# Patient Record
Sex: Female | Born: 1990
Health system: Southern US, Community
[De-identification: ages and names within clinical notes are randomized; demographics above are authoritative.]

## PROBLEM LIST (undated history)

## (undated) DIAGNOSIS — R519 Headache, unspecified: Secondary | ICD-10-CM

## (undated) DIAGNOSIS — R51 Headache: Secondary | ICD-10-CM

## (undated) DIAGNOSIS — K769 Liver disease, unspecified: Secondary | ICD-10-CM

## (undated) DIAGNOSIS — Z8619 Personal history of other infectious and parasitic diseases: Secondary | ICD-10-CM

## (undated) DIAGNOSIS — O24419 Gestational diabetes mellitus in pregnancy, unspecified control: Secondary | ICD-10-CM

## (undated) HISTORY — PX: KNEE SURGERY: SHX244

---
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
## 2009-08-22 ENCOUNTER — Encounter: Admission: RE | Admit: 2009-08-22 | Discharge: 2009-08-22 | Payer: Self-pay | Admitting: Pulmonary Disease

## 2010-06-19 NOTE — Op Note (Signed)
NAMETOBI, GROESBECK              ACCOUNT NO.:  [id number]   MEDICAL RECORD NO.:  [id number]          PATIENT TYPE:  AMB   LOCATION:  DSC                          FACILITY:  [facility]   PHYSICIAN:  Lubertha Basque. Dalldorf, M.D.DATE OF BIRTH:  [birth year]   DATE OF PROCEDURE:  03/29/2006  DATE OF DISCHARGE:                               OPERATIVE REPORT   PREOPERATIVE DIAGNOSIS:  Right knee torn lateral meniscus.   POSTOPERATIVE DIAGNOSIS:  Right knee torn lateral meniscus.   PROCEDURE:  Right knee partial lateral meniscectomy.   ANESTHESIA:  General.   ATTENDING SURGEON:  Lubertha Basque. Jerl Santos, M.D.   ASSISTANT:  Lindwood Qua, P.A.   INDICATIONS FOR PROCEDURE:  The patient is a 20 year old girl with  several months of right knee pain and swelling.  She has an occasional  locking episode as well.  She has pain which limits her ability to  exercise and to rest.  She has undergone an MRI scan which shows a  complex lateral meniscus tear.  She is offered arthroscopy.  Informed  operative consent was obtained discussion of possible complications of  reaction to anesthesia and infection.   SUMMARY OF FINDINGS AND PROCEDURE:  Under general anesthesia a right  knee arthroscopy was performed.  Medial compartment exhibited no  evidence of meniscal or articular cartilage injury.  The ACL appeared  intact.  The patellofemoral portion of the knee was also benign though  it did track in a fairly lateral position.  In the lateral compartment,  she did have a complex tear of the posterior horn lateral meniscus.  This was addressed with about a 15% partial lateral meniscectomy.  She  was left with some good substantial tissue anterior to the popliteal  tendon.  There were no degenerative changes in this compartment that I  could appreciate.   DESCRIPTION OF PROCEDURE:  The patient was taken to the operative suite  where general anesthetic was applied without difficulty.  She was  positioned supine  and prepped, draped in normal sterile fashion.  After  administration of preop IV Kefzol an arthroscopy of the right knee was  performed through two inferior portals.  Findings were as noted above  and procedure consisted of a partial lateral meniscectomy done with  shaver and basket removing about 15% of this structure predominantly in  the posterior horn.  The knee was thoroughly irrigated followed by  placement of some Marcaine with epinephrine and morphine.  Adaptic was  placed over the portals followed by dry gauze and loose Ace wrap.  Estimated blood loss and intraoperative fluids can be obtained from  anesthesia records.   DISPOSITION:  The patient was extubated in the operating room and taken  to recovery room in stable addition.  She was to go home same-day and to  follow-up in the office in less than week.  I will contact her by phone  tonight.      Lubertha Basque Jerl Santos, M.D.  Electronically Signed     PGD/MEDQ  D:  03/29/2006  T:  03/29/2006  Job:  [id number]

## 2011-04-15 ENCOUNTER — Other Ambulatory Visit (HOSPITAL_COMMUNITY): Payer: Self-pay | Admitting: Pulmonary Disease

## 2011-04-15 ENCOUNTER — Ambulatory Visit (HOSPITAL_COMMUNITY)
Admission: RE | Admit: 2011-04-15 | Discharge: 2011-04-15 | Disposition: A | Discharge status: Home / Self Care | Payer: BC Managed Care – PPO | Source: Ambulatory Visit | Attending: Pulmonary Disease | Admitting: Pulmonary Disease

## 2011-04-15 DIAGNOSIS — M79609 Pain in unspecified limb: Secondary | ICD-10-CM | POA: Insufficient documentation

## 2011-04-15 DIAGNOSIS — M7989 Other specified soft tissue disorders: Secondary | ICD-10-CM | POA: Insufficient documentation

## 2011-04-15 DIAGNOSIS — M79671 Pain in right foot: Secondary | ICD-10-CM

## 2014-07-10 ENCOUNTER — Encounter (INDEPENDENT_AMBULATORY_CARE_PROVIDER_SITE_OTHER): Payer: Self-pay

## 2014-07-10 ENCOUNTER — Ambulatory Visit (HOSPITAL_COMMUNITY)
Admission: RE | Admit: 2014-07-10 | Discharge: 2014-07-10 | Disposition: A | Discharge status: Home / Self Care | Payer: 59 | Source: Ambulatory Visit | Attending: Pulmonary Disease | Admitting: Pulmonary Disease

## 2014-07-10 ENCOUNTER — Other Ambulatory Visit (HOSPITAL_COMMUNITY): Payer: Self-pay | Admitting: Pulmonary Disease

## 2014-07-10 DIAGNOSIS — R52 Pain, unspecified: Secondary | ICD-10-CM

## 2014-07-10 DIAGNOSIS — M79671 Pain in right foot: Secondary | ICD-10-CM | POA: Insufficient documentation

## 2015-01-16 ENCOUNTER — Other Ambulatory Visit (HOSPITAL_COMMUNITY): Payer: Self-pay | Admitting: Pulmonary Disease

## 2015-01-16 ENCOUNTER — Ambulatory Visit (HOSPITAL_COMMUNITY)
Admission: RE | Admit: 2015-01-16 | Discharge: 2015-01-16 | Disposition: A | Discharge status: Home / Self Care | Payer: 59 | Source: Ambulatory Visit | Attending: Pulmonary Disease | Admitting: Pulmonary Disease

## 2015-01-16 DIAGNOSIS — R05 Cough: Secondary | ICD-10-CM | POA: Insufficient documentation

## 2015-01-16 DIAGNOSIS — R0989 Other specified symptoms and signs involving the circulatory and respiratory systems: Secondary | ICD-10-CM | POA: Diagnosis not present

## 2015-01-16 DIAGNOSIS — R059 Cough, unspecified: Secondary | ICD-10-CM

## 2015-02-19 DIAGNOSIS — N911 Secondary amenorrhea: Secondary | ICD-10-CM | POA: Diagnosis not present

## 2015-03-05 DIAGNOSIS — Z3401 Encounter for supervision of normal first pregnancy, first trimester: Secondary | ICD-10-CM | POA: Diagnosis not present

## 2015-03-05 DIAGNOSIS — Z36 Encounter for antenatal screening of mother: Secondary | ICD-10-CM | POA: Diagnosis not present

## 2015-03-05 DIAGNOSIS — Z3A09 9 weeks gestation of pregnancy: Secondary | ICD-10-CM | POA: Diagnosis not present

## 2015-03-05 LAB — OB RESULTS CONSOLE HEPATITIS B SURFACE ANTIGEN: Hepatitis B Surface Ag: NEGATIVE

## 2015-03-05 LAB — OB RESULTS CONSOLE GC/CHLAMYDIA
Chlamydia: NEGATIVE
Gonorrhea: NEGATIVE

## 2015-03-05 LAB — OB RESULTS CONSOLE HIV ANTIBODY (ROUTINE TESTING): HIV: NONREACTIVE

## 2015-03-05 LAB — OB RESULTS CONSOLE RPR: RPR: NONREACTIVE

## 2015-03-05 LAB — OB RESULTS CONSOLE RUBELLA ANTIBODY, IGM: Rubella: IMMUNE

## 2015-03-05 LAB — OB RESULTS CONSOLE ABO/RH: RH TYPE: POSITIVE

## 2015-03-05 LAB — OB RESULTS CONSOLE ANTIBODY SCREEN: Antibody Screen: NEGATIVE

## 2015-03-12 DIAGNOSIS — Z36 Encounter for antenatal screening of mother: Secondary | ICD-10-CM | POA: Diagnosis not present

## 2015-03-13 DIAGNOSIS — Z3A11 11 weeks gestation of pregnancy: Secondary | ICD-10-CM | POA: Diagnosis not present

## 2015-03-13 DIAGNOSIS — Z3401 Encounter for supervision of normal first pregnancy, first trimester: Secondary | ICD-10-CM | POA: Diagnosis not present

## 2015-03-19 DIAGNOSIS — Z3491 Encounter for supervision of normal pregnancy, unspecified, first trimester: Secondary | ICD-10-CM | POA: Diagnosis not present

## 2015-03-19 DIAGNOSIS — Z36 Encounter for antenatal screening of mother: Secondary | ICD-10-CM | POA: Diagnosis not present

## 2015-05-07 DIAGNOSIS — Z3402 Encounter for supervision of normal first pregnancy, second trimester: Secondary | ICD-10-CM | POA: Diagnosis not present

## 2015-05-07 DIAGNOSIS — Z36 Encounter for antenatal screening of mother: Secondary | ICD-10-CM | POA: Diagnosis not present

## 2015-05-07 DIAGNOSIS — Z3A18 18 weeks gestation of pregnancy: Secondary | ICD-10-CM | POA: Diagnosis not present

## 2015-06-05 DIAGNOSIS — Z3402 Encounter for supervision of normal first pregnancy, second trimester: Secondary | ICD-10-CM | POA: Diagnosis not present

## 2015-06-05 DIAGNOSIS — O350XX0 Maternal care for (suspected) central nervous system malformation in fetus, not applicable or unspecified: Secondary | ICD-10-CM | POA: Diagnosis not present

## 2015-06-05 DIAGNOSIS — Z3A23 23 weeks gestation of pregnancy: Secondary | ICD-10-CM | POA: Diagnosis not present

## 2015-07-10 DIAGNOSIS — Z36 Encounter for antenatal screening of mother: Secondary | ICD-10-CM | POA: Diagnosis not present

## 2015-07-10 DIAGNOSIS — Z3400 Encounter for supervision of normal first pregnancy, unspecified trimester: Secondary | ICD-10-CM | POA: Diagnosis not present

## 2015-07-10 DIAGNOSIS — Z23 Encounter for immunization: Secondary | ICD-10-CM | POA: Diagnosis not present

## 2015-07-17 DIAGNOSIS — O99810 Abnormal glucose complicating pregnancy: Secondary | ICD-10-CM | POA: Diagnosis not present

## 2015-08-25 DIAGNOSIS — D509 Iron deficiency anemia, unspecified: Secondary | ICD-10-CM | POA: Diagnosis not present

## 2015-08-25 DIAGNOSIS — Z3400 Encounter for supervision of normal first pregnancy, unspecified trimester: Secondary | ICD-10-CM | POA: Diagnosis not present

## 2015-09-04 DIAGNOSIS — Z36 Encounter for antenatal screening of mother: Secondary | ICD-10-CM | POA: Diagnosis not present

## 2015-09-04 DIAGNOSIS — Z3A36 36 weeks gestation of pregnancy: Secondary | ICD-10-CM | POA: Diagnosis not present

## 2015-09-04 DIAGNOSIS — Z3400 Encounter for supervision of normal first pregnancy, unspecified trimester: Secondary | ICD-10-CM | POA: Diagnosis not present

## 2015-09-04 DIAGNOSIS — O3663X0 Maternal care for excessive fetal growth, third trimester, not applicable or unspecified: Secondary | ICD-10-CM | POA: Diagnosis not present

## 2015-09-30 ENCOUNTER — Encounter (HOSPITAL_COMMUNITY): Payer: Self-pay | Admitting: *Deleted

## 2015-09-30 DIAGNOSIS — Z3400 Encounter for supervision of normal first pregnancy, unspecified trimester: Secondary | ICD-10-CM | POA: Diagnosis not present

## 2015-09-30 DIAGNOSIS — O3663X0 Maternal care for excessive fetal growth, third trimester, not applicable or unspecified: Secondary | ICD-10-CM | POA: Diagnosis not present

## 2015-09-30 DIAGNOSIS — Z3A39 39 weeks gestation of pregnancy: Secondary | ICD-10-CM | POA: Diagnosis not present

## 2015-09-30 NOTE — H&P (Signed)
Gina Richard is a 25 y.o. female presenting for primary C/S secondary to suspected fetal macrosomia.  Last u/s 8/29 EFW 4461g with large AC.  Patient was counseled re: risks of shoulder dystocia/traumatic vaginal delivery vs primary C/S and chose the latter.  Otherwise uncomplicated pregnancy.  GBS negative.  OB History    No data available     No past medical history on file. No past surgical history on file. Family History: family history is not on file. Social History:  has no tobacco, alcohol, and drug history on file.     Maternal Diabetes: No Genetic Screening: Normal Maternal Ultrasounds/Referrals: Normal Fetal Ultrasounds or other Referrals:  None Maternal Substance Abuse:  No Significant Maternal Medications:  None Significant Maternal Lab Results:  Lab values include: Group B Strep negative Other Comments:  None  ROS Maternal Medical History:  Fetal activity: Perceived fetal activity is normal.   Last perceived fetal movement was within the past hour.    Prenatal complications: no prenatal complications Prenatal Complications - Diabetes: none.      There were no vitals taken for this visit. Maternal Exam:  Abdomen: Patient reports no abdominal tenderness. Fundal height is S>D.   Estimated fetal weight is 10#.       Physical Exam  Constitutional: She is oriented to person, place, and time. She appears well-developed and well-nourished.  GI: Soft. There is no rebound and no guarding.  Neurological: She is alert and oriented to person, place, and time.  Skin: Skin is warm and dry.  Psychiatric: She has a normal mood and affect. Her behavior is normal.    Prenatal labs: ABO, Rh:   Antibody:   Rubella:   RPR:    HBsAg:    HIV:    GBS:     Assessment/Plan: 25yo G1 at [redacted]w[redacted]d with suspected fetal macrosomia -Primary C/S -Counseled re: risk of bleeding, infection, scarring and damage to surrounding structures.  She understands the 1% risk of uterine  rupture in subsequent pregnancies.  All questions were answered and the patient wishes to proceed.   Gina Richard, [place] 09/30/2015, 2:31 PM

## 2015-10-01 ENCOUNTER — Inpatient Hospital Stay (HOSPITAL_COMMUNITY): Payer: 59 | Admitting: Anesthesiology

## 2015-10-01 ENCOUNTER — Encounter (HOSPITAL_COMMUNITY): Payer: Self-pay

## 2015-10-01 ENCOUNTER — Inpatient Hospital Stay (HOSPITAL_COMMUNITY)
Admission: AD | Admit: 2015-10-01 | Discharge: 2015-10-04 | DRG: 766 | Disposition: A | Discharge status: Home / Self Care | Payer: 59 | Source: Ambulatory Visit | Attending: Obstetrics & Gynecology | Admitting: Obstetrics & Gynecology

## 2015-10-01 ENCOUNTER — Encounter (HOSPITAL_COMMUNITY)
Admission: AD | Disposition: A | Discharge status: Home / Self Care | Payer: Self-pay | Source: Ambulatory Visit | Attending: Obstetrics & Gynecology

## 2015-10-01 DIAGNOSIS — Z3A39 39 weeks gestation of pregnancy: Secondary | ICD-10-CM | POA: Diagnosis not present

## 2015-10-01 DIAGNOSIS — O3663X0 Maternal care for excessive fetal growth, third trimester, not applicable or unspecified: Principal | ICD-10-CM | POA: Diagnosis present

## 2015-10-01 DIAGNOSIS — Z98891 History of uterine scar from previous surgery: Secondary | ICD-10-CM

## 2015-10-01 DIAGNOSIS — O3663X1 Maternal care for excessive fetal growth, third trimester, fetus 1: Secondary | ICD-10-CM | POA: Diagnosis not present

## 2015-10-01 DIAGNOSIS — O3663X9 Maternal care for excessive fetal growth, third trimester, other fetus: Secondary | ICD-10-CM | POA: Diagnosis not present

## 2015-10-01 HISTORY — DX: Headache: R51

## 2015-10-01 HISTORY — DX: Personal history of other infectious and parasitic diseases: Z86.19

## 2015-10-01 HISTORY — DX: Headache, unspecified: R51.9

## 2015-10-01 LAB — TYPE AND SCREEN
ABO/RH(D): A POS
Antibody Screen: NEGATIVE

## 2015-10-01 LAB — CBC
HCT: 35 % — ABNORMAL LOW (ref 36.0–46.0)
Hemoglobin: 11.2 g/dL — ABNORMAL LOW (ref 12.0–15.0)
MCH: 27.8 pg (ref 26.0–34.0)
MCHC: 32 g/dL (ref 30.0–36.0)
MCV: 86.8 fL (ref 78.0–100.0)
PLATELETS: 235 10*3/uL (ref 150–400)
RBC: 4.03 MIL/uL (ref 3.87–5.11)
RDW: 15.1 % (ref 11.5–15.5)
WBC: 10.9 10*3/uL — ABNORMAL HIGH (ref 4.0–10.5)

## 2015-10-01 LAB — ABO/RH: ABO/RH(D): A POS

## 2015-10-01 SURGERY — Surgical Case
Anesthesia: Spinal

## 2015-10-01 MED ORDER — FENTANYL CITRATE (PF) 100 MCG/2ML IJ SOLN: 25.0000 ug | INTRAMUSCULAR | Status: DC | PRN

## 2015-10-01 MED ORDER — SODIUM CHLORIDE 0.9 % IR SOLN
Status: DC | PRN
  Administered 2015-10-01: 1

## 2015-10-01 MED ORDER — SCOPOLAMINE 1 MG/3DAYS TD PT72: 1.0000 | MEDICATED_PATCH | Freq: Once | TRANSDERMAL | Status: DC

## 2015-10-01 MED ORDER — NALBUPHINE HCL 10 MG/ML IJ SOLN
5.0000 mg | Freq: Once | INTRAMUSCULAR | Status: AC | PRN
  Administered 2015-10-01: 5 mg via SUBCUTANEOUS

## 2015-10-01 MED ORDER — FENTANYL CITRATE (PF) 100 MCG/2ML IJ SOLN
INTRAMUSCULAR | Status: AC
  Filled 2015-10-01: qty 2

## 2015-10-01 MED ORDER — PHENYLEPHRINE 8 MG IN D5W 100 ML (0.08MG/ML) PREMIX OPTIME
INJECTION | INTRAVENOUS | Status: AC
  Filled 2015-10-01: qty 100

## 2015-10-01 MED ORDER — PRENATAL MULTIVITAMIN CH
1.0000 | ORAL_TABLET | Freq: Every day | ORAL | Status: DC
  Administered 2015-10-02 – 2015-10-04 (×3): 1 via ORAL
  Filled 2015-10-01 (×3): qty 1

## 2015-10-01 MED ORDER — BUPIVACAINE IN DEXTROSE 0.75-8.25 % IT SOLN
INTRATHECAL | Status: DC | PRN
  Administered 2015-10-01: 1.6 mL via INTRATHECAL

## 2015-10-01 MED ORDER — ONDANSETRON HCL 4 MG/2ML IJ SOLN: 4.0000 mg | Freq: Three times a day (TID) | INTRAMUSCULAR | Status: DC | PRN

## 2015-10-01 MED ORDER — LACTATED RINGERS IV SOLN: INTRAVENOUS | Status: DC

## 2015-10-01 MED ORDER — NALOXONE HCL 0.4 MG/ML IJ SOLN: 0.4000 mg | INTRAMUSCULAR | Status: DC | PRN

## 2015-10-01 MED ORDER — NALBUPHINE HCL 10 MG/ML IJ SOLN
5.0000 mg | INTRAMUSCULAR | Status: DC | PRN
  Administered 2015-10-01: 5 mg via INTRAVENOUS
  Filled 2015-10-01: qty 1

## 2015-10-01 MED ORDER — IBUPROFEN 600 MG PO TABS: 600.0000 mg | ORAL_TABLET | Freq: Four times a day (QID) | ORAL | Status: DC

## 2015-10-01 MED ORDER — SIMETHICONE 80 MG PO CHEW
80.0000 mg | CHEWABLE_TABLET | ORAL | Status: DC
  Administered 2015-10-02 – 2015-10-03 (×3): 80 mg via ORAL
  Filled 2015-10-01 (×5): qty 1

## 2015-10-01 MED ORDER — NALBUPHINE HCL 10 MG/ML IJ SOLN: 5.0000 mg | Freq: Once | INTRAMUSCULAR | Status: AC | PRN

## 2015-10-01 MED ORDER — DEXTROSE 5 % IV SOLN
3.0000 g | INTRAVENOUS | Status: DC
  Filled 2015-10-01: qty 3000

## 2015-10-01 MED ORDER — DIPHENHYDRAMINE HCL 25 MG PO CAPS
25.0000 mg | ORAL_CAPSULE | ORAL | Status: DC | PRN
Start: 2015-10-01 — End: 2015-10-04
  Administered 2015-10-02: 25 mg via ORAL
  Filled 2015-10-01 (×2): qty 1

## 2015-10-01 MED ORDER — LACTATED RINGERS IV SOLN
INTRAVENOUS | Status: DC | PRN
  Administered 2015-10-01: 13:00:00 via INTRAVENOUS

## 2015-10-01 MED ORDER — COCONUT OIL OIL
1.0000 "application " | TOPICAL_OIL | Status: DC | PRN
  Administered 2015-10-02: 1 via TOPICAL
  Filled 2015-10-01: qty 120

## 2015-10-01 MED ORDER — LACTATED RINGERS IV SOLN
INTRAVENOUS | Status: DC
  Administered 2015-10-01 (×2): via INTRAVENOUS

## 2015-10-01 MED ORDER — MEPERIDINE HCL 25 MG/ML IJ SOLN: 6.2500 mg | INTRAMUSCULAR | Status: DC | PRN

## 2015-10-01 MED ORDER — CEFAZOLIN SODIUM-DEXTROSE 2-4 GM/100ML-% IV SOLN: 2.0000 g | INTRAVENOUS | Status: DC

## 2015-10-01 MED ORDER — NALBUPHINE HCL 10 MG/ML IJ SOLN
INTRAMUSCULAR | Status: AC
  Administered 2015-10-01: 5 mg via INTRAVENOUS
  Filled 2015-10-01: qty 1

## 2015-10-01 MED ORDER — SENNOSIDES-DOCUSATE SODIUM 8.6-50 MG PO TABS
2.0000 | ORAL_TABLET | ORAL | Status: DC
  Administered 2015-10-02 – 2015-10-03 (×3): 2 via ORAL
  Filled 2015-10-01 (×5): qty 2

## 2015-10-01 MED ORDER — IBUPROFEN 600 MG PO TABS
600.0000 mg | ORAL_TABLET | Freq: Four times a day (QID) | ORAL | Status: DC
  Administered 2015-10-02 – 2015-10-04 (×11): 600 mg via ORAL
  Filled 2015-10-01 (×12): qty 1

## 2015-10-01 MED ORDER — NALBUPHINE HCL 10 MG/ML IJ SOLN: 5.0000 mg | INTRAMUSCULAR | Status: DC | PRN

## 2015-10-01 MED ORDER — OXYCODONE-ACETAMINOPHEN 5-325 MG PO TABS: 2.0000 | ORAL_TABLET | ORAL | Status: DC | PRN

## 2015-10-01 MED ORDER — LACTATED RINGERS IV SOLN
Freq: Once | INTRAVENOUS | Status: AC
  Administered 2015-10-01: 1000 mL/h via INTRAVENOUS

## 2015-10-01 MED ORDER — LACTATED RINGERS IV SOLN
INTRAVENOUS | Status: DC
  Administered 2015-10-01: 18:00:00 via INTRAVENOUS

## 2015-10-01 MED ORDER — ACETAMINOPHEN 500 MG PO TABS
1000.0000 mg | ORAL_TABLET | Freq: Four times a day (QID) | ORAL | Status: AC
  Administered 2015-10-01 – 2015-10-02 (×4): 1000 mg via ORAL
  Filled 2015-10-01 (×4): qty 2

## 2015-10-01 MED ORDER — CEFAZOLIN SODIUM-DEXTROSE 2-3 GM-% IV SOLR
INTRAVENOUS | Status: DC | PRN
  Administered 2015-10-01: 2 g via INTRAVENOUS

## 2015-10-01 MED ORDER — DIBUCAINE 1 % RE OINT: 1.0000 "application " | TOPICAL_OINTMENT | RECTAL | Status: DC | PRN

## 2015-10-01 MED ORDER — KETOROLAC TROMETHAMINE 30 MG/ML IJ SOLN
INTRAMUSCULAR | Status: AC
  Administered 2015-10-01: 30 mg via INTRAMUSCULAR
  Filled 2015-10-01: qty 1

## 2015-10-01 MED ORDER — ONDANSETRON HCL 4 MG/2ML IJ SOLN
INTRAMUSCULAR | Status: DC | PRN
  Administered 2015-10-01: 4 mg via INTRAVENOUS

## 2015-10-01 MED ORDER — DIPHENHYDRAMINE HCL 25 MG PO CAPS
25.0000 mg | ORAL_CAPSULE | Freq: Four times a day (QID) | ORAL | Status: DC | PRN
  Filled 2015-10-01: qty 1

## 2015-10-01 MED ORDER — PHENYLEPHRINE 8 MG IN D5W 100 ML (0.08MG/ML) PREMIX OPTIME
INJECTION | INTRAVENOUS | Status: DC | PRN
  Administered 2015-10-01: 60 ug/min via INTRAVENOUS

## 2015-10-01 MED ORDER — SIMETHICONE 80 MG PO CHEW: 80.0000 mg | CHEWABLE_TABLET | ORAL | Status: DC | PRN

## 2015-10-01 MED ORDER — FENTANYL CITRATE (PF) 100 MCG/2ML IJ SOLN
INTRAMUSCULAR | Status: DC | PRN
  Administered 2015-10-01: 20 ug via INTRATHECAL

## 2015-10-01 MED ORDER — ZOLPIDEM TARTRATE 5 MG PO TABS: 5.0000 mg | ORAL_TABLET | Freq: Every evening | ORAL | Status: DC | PRN

## 2015-10-01 MED ORDER — OXYTOCIN 10 UNIT/ML IJ SOLN
INTRAMUSCULAR | Status: AC
  Filled 2015-10-01: qty 4

## 2015-10-01 MED ORDER — SODIUM CHLORIDE 0.9% FLUSH: 3.0000 mL | INTRAVENOUS | Status: DC | PRN

## 2015-10-01 MED ORDER — KETOROLAC TROMETHAMINE 30 MG/ML IJ SOLN: 30.0000 mg | Freq: Four times a day (QID) | INTRAMUSCULAR | Status: AC | PRN

## 2015-10-01 MED ORDER — OXYTOCIN 40 UNITS IN LACTATED RINGERS INFUSION - SIMPLE MED: 2.5000 [IU]/h | INTRAVENOUS | Status: AC

## 2015-10-01 MED ORDER — MENTHOL 3 MG MT LOZG: 1.0000 | LOZENGE | OROMUCOSAL | Status: DC | PRN

## 2015-10-01 MED ORDER — OXYTOCIN 10 UNIT/ML IJ SOLN
INTRAVENOUS | Status: DC | PRN
  Administered 2015-10-01: 40 [IU] via INTRAVENOUS

## 2015-10-01 MED ORDER — NALOXONE HCL 2 MG/2ML IJ SOSY
1.0000 ug/kg/h | PREFILLED_SYRINGE | INTRAVENOUS | Status: DC | PRN
  Filled 2015-10-01: qty 2

## 2015-10-01 MED ORDER — SCOPOLAMINE 1 MG/3DAYS TD PT72
1.0000 | MEDICATED_PATCH | Freq: Once | TRANSDERMAL | Status: DC
  Administered 2015-10-01: 1.5 mg via TRANSDERMAL

## 2015-10-01 MED ORDER — SIMETHICONE 80 MG PO CHEW
80.0000 mg | CHEWABLE_TABLET | Freq: Three times a day (TID) | ORAL | Status: DC
  Administered 2015-10-01 – 2015-10-04 (×8): 80 mg via ORAL
  Filled 2015-10-01 (×13): qty 1

## 2015-10-01 MED ORDER — WITCH HAZEL-GLYCERIN EX PADS: 1.0000 "application " | MEDICATED_PAD | CUTANEOUS | Status: DC | PRN

## 2015-10-01 MED ORDER — SCOPOLAMINE 1 MG/3DAYS TD PT72
MEDICATED_PATCH | TRANSDERMAL | Status: AC
  Administered 2015-10-01: 1.5 mg via TRANSDERMAL
  Filled 2015-10-01: qty 1

## 2015-10-01 MED ORDER — PROMETHAZINE HCL 25 MG/ML IJ SOLN: 6.2500 mg | INTRAMUSCULAR | Status: DC | PRN

## 2015-10-01 MED ORDER — MORPHINE SULFATE-NACL 0.5-0.9 MG/ML-% IV SOSY
PREFILLED_SYRINGE | INTRAVENOUS | Status: AC
  Filled 2015-10-01: qty 1

## 2015-10-01 MED ORDER — DIPHENHYDRAMINE HCL 50 MG/ML IJ SOLN
12.5000 mg | INTRAMUSCULAR | Status: DC | PRN
  Administered 2015-10-01: 12.5 mg via INTRAVENOUS
  Filled 2015-10-01: qty 1

## 2015-10-01 MED ORDER — MORPHINE SULFATE (PF) 0.5 MG/ML IJ SOLN
INTRAMUSCULAR | Status: DC | PRN
  Administered 2015-10-01: .15 mg via INTRATHECAL

## 2015-10-01 MED ORDER — ONDANSETRON HCL 4 MG/2ML IJ SOLN
INTRAMUSCULAR | Status: AC
  Filled 2015-10-01: qty 2

## 2015-10-01 MED ORDER — ACETAMINOPHEN 325 MG PO TABS
650.0000 mg | ORAL_TABLET | ORAL | Status: DC | PRN
  Administered 2015-10-02 – 2015-10-03 (×3): 650 mg via ORAL
  Filled 2015-10-01 (×3): qty 2

## 2015-10-01 MED ORDER — TETANUS-DIPHTH-ACELL PERTUSSIS 5-2.5-18.5 LF-MCG/0.5 IM SUSP: 0.5000 mL | Freq: Once | INTRAMUSCULAR | Status: DC

## 2015-10-01 MED ORDER — OXYCODONE-ACETAMINOPHEN 5-325 MG PO TABS: 1.0000 | ORAL_TABLET | ORAL | Status: DC | PRN

## 2015-10-01 SURGICAL SUPPLY — 35 items
APL SKNCLS STERI-STRIP NONHPOA (GAUZE/BANDAGES/DRESSINGS) ×1
BENZOIN TINCTURE PRP APPL 2/3 (GAUZE/BANDAGES/DRESSINGS) ×3 IMPLANT
CHLORAPREP W/TINT 26ML (MISCELLANEOUS) ×3 IMPLANT
CLAMP CORD UMBIL (MISCELLANEOUS) IMPLANT
CLOSURE STERI STRIP 1/2 X4 (GAUZE/BANDAGES/DRESSINGS) ×2 IMPLANT
CLOSURE WOUND 1/2 X4 (GAUZE/BANDAGES/DRESSINGS)
CLOTH BEACON ORANGE TIMEOUT ST (SAFETY) ×3 IMPLANT
DRSG OPSITE POSTOP 4X10 (GAUZE/BANDAGES/DRESSINGS) ×3 IMPLANT
ELECT REM PT RETURN 9FT ADLT (ELECTROSURGICAL) ×3
ELECTRODE REM PT RTRN 9FT ADLT (ELECTROSURGICAL) ×1 IMPLANT
EXTRACTOR VACUUM KIWI (MISCELLANEOUS) IMPLANT
GLOVE BIO SURGEON STRL SZ 6 (GLOVE) ×3 IMPLANT
GLOVE BIOGEL PI IND STRL 6 (GLOVE) ×2 IMPLANT
GLOVE BIOGEL PI IND STRL 7.0 (GLOVE) ×1 IMPLANT
GLOVE BIOGEL PI INDICATOR 6 (GLOVE) ×4
GLOVE BIOGEL PI INDICATOR 7.0 (GLOVE) ×2
GOWN STRL REUS W/TWL LRG LVL3 (GOWN DISPOSABLE) ×6 IMPLANT
KIT ABG SYR 3ML LUER SLIP (SYRINGE) ×3 IMPLANT
LIQUID BAND (GAUZE/BANDAGES/DRESSINGS) IMPLANT
NDL HYPO 25X5/8 SAFETYGLIDE (NEEDLE) ×1 IMPLANT
NEEDLE HYPO 25X5/8 SAFETYGLIDE (NEEDLE) ×3 IMPLANT
NS IRRIG 1000ML POUR BTL (IV SOLUTION) ×3 IMPLANT
PACK C SECTION WH (CUSTOM PROCEDURE TRAY) ×3 IMPLANT
PAD OB MATERNITY 4.3X12.25 (PERSONAL CARE ITEMS) ×3 IMPLANT
PENCIL SMOKE EVAC W/HOLSTER (ELECTROSURGICAL) ×3 IMPLANT
STRIP CLOSURE SKIN 1/2X4 (GAUZE/BANDAGES/DRESSINGS) IMPLANT
SUT CHROMIC 0 CTX 36 (SUTURE) ×9 IMPLANT
SUT MON AB 2-0 CT1 27 (SUTURE) ×3 IMPLANT
SUT PDS AB 0 CT1 27 (SUTURE) IMPLANT
SUT PLAIN 0 NONE (SUTURE) IMPLANT
SUT PLAIN 2 0 XLH (SUTURE) ×2 IMPLANT
SUT VIC AB 0 CT1 36 (SUTURE) IMPLANT
SUT VIC AB 4-0 KS 27 (SUTURE) IMPLANT
TOWEL OR 17X24 6PK STRL BLUE (TOWEL DISPOSABLE) ×3 IMPLANT
TRAY FOLEY CATH SILVER 14FR (SET/KITS/TRAYS/PACK) IMPLANT

## 2015-10-01 NOTE — Anesthesia Procedure Notes (Signed)
Spinal  Patient location during procedure: OR Start time: 10/01/2015 12:55 PM End time: 10/01/2015 12:57 PM Staffing Anesthesiologist: Reginal Lutes Performed: anesthesiologist  Preanesthetic Checklist Completed: patient identified, site marked, surgical consent, pre-op evaluation, timeout performed, IV checked, risks and benefits discussed and monitors and equipment checked Spinal Block Patient position: sitting Prep: ChloraPrep Patient monitoring: blood pressure, continuous pulse ox, cardiac monitor and heart rate Approach: midline Location: L4-5 Injection technique: single-shot Needle Needle type: Pencil-Tip  Needle gauge: 25 G

## 2015-10-01 NOTE — Progress Notes (Signed)
Assisted pt up to bathroom to teach peri care. Pt ambulated without any difficulty. Gait steady. Pt tolerated ambulation well.

## 2015-10-01 NOTE — Transfer of Care (Signed)
Immediate Anesthesia Transfer of Care Note  Patient: Gina Richard  Procedure(s) Performed: Procedure(s) with comments: CESAREAN SECTION (N/A) - Primary edc 10/02/15 NKDA  Patient Location: PACU  Anesthesia Type:Spinal  Level of Consciousness: awake, alert  and oriented  Airway & Oxygen Therapy: Patient Spontanous Breathing  Post-op Assessment: Report given to RN and Post -op Vital signs reviewed and stable  Post vital signs: Reviewed and stable  Last Vitals:  Vitals:   10/01/15 1132  BP: 133/88  Pulse: 94  Resp: 16  Temp: 37.1 C    Last Pain:  Vitals:   10/01/15 1132  TempSrc: Oral      Patients Stated Pain Goal: 5 ([id number])  Complications: No apparent anesthesia complications

## 2015-10-01 NOTE — Op Note (Signed)
[name] PROCEDURE DATE: 10/01/2015  PREOPERATIVE DIAGNOSIS: Intrauterine pregnancy at  [redacted]w[redacted]d weeks gestation, suspected fetal macrosomia  POSTOPERATIVE DIAGNOSIS: The same  PROCEDURE:  Primary Low Transverse Cesarean Section  SURGEON:  Dr. Linda Hedges  INDICATIONS: Gina Richard is a 25 y.o. G1P0 at [redacted]w[redacted]d scheduled for cesarean section secondary to suspected fetal macrosomia.  The risks of cesarean section discussed with the patient included but were not limited to: bleeding which may require transfusion or reoperation; infection which may require antibiotics; injury to bowel, bladder, ureters or other surrounding organs; injury to the fetus; need for additional procedures including hysterectomy in the event of a life-threatening hemorrhage; placental abnormalities wth subsequent pregnancies, incisional problems, thromboembolic phenomenon and other postoperative/anesthesia complications. The patient concurred with the proposed plan, giving informed written consent for the procedure.    FINDINGS:  Viable female infant in cephalic presentation, APGARs 9,9:  Weight pending  clear amniotic fluid.  Intact placenta, three vessel cord.  Grossly normal uterus, ovaries and fallopian tubes. .   ANESTHESIA: Spinal ESTIMATED BLOOD LOSS: 700 ml SPECIMENS: Placenta sent to L&D COMPLICATIONS: None immediate  PROCEDURE IN DETAIL:  The patient received intravenous antibiotics and had sequential compression devices applied to her lower extremities while in the preoperative area.  She was then taken to the operating room where spinal anesthesia was administered and was found to be adequate. She was then placed in a dorsal supine position with a leftward tilt, and prepped and draped in a sterile manner.  A foley catheter was placed into her bladder and attached to constant gravity.  After an adequate timeout was performed, a Pfannenstiel skin incision was made with scalpel and carried through to the  underlying layer of fascia. The fascia was incised in the midline and this incision was extended bilaterally using the Mayo scissors. Kocher clamps were applied to the superior aspect of the fascial incision and the underlying rectus muscles were dissected off bluntly. A similar process was carried out on the inferior aspect of the facial incision. The rectus muscles were separated in the midline bluntly and the peritoneum was entered bluntly.   A bladder flap was created sharply and developed bluntly.  Bladder blade was placed.  A transverse hysterotomy was made with a scalpel and extended bilaterally bluntly. The bladder blade was then removed. The infant was successfully delivered, and cord was clamped and cut and infant was handed over to awaiting neonatology team. Uterine massage was then administered and the placenta delivered intact with three-vessel cord. The uterus was cleared of clot and debris.  The hysterotomy was closed with 0 chromic.  A second imbricating suture of 0-chromic was used to reinforce the incision and aid in hemostasis.  The peritoneum and rectus muscles were noted to be hemostatic and were reapproximated using 2-0 monocryl.  The fascia was closed with 0-Vicryl in a running fashion with good restoration of anatomy.  The subcutaneus tissue was copiously irrigated and reapproximated using three interrupted plain gut stitches.  The skin was closed with 4-0 vicryl in a subcuticular fashion.  Pt tolerated the procedure will.  All counts were correct x2.  Pt went to the recovery room in stable condition.

## 2015-10-01 NOTE — Anesthesia Preprocedure Evaluation (Addendum)
Anesthesia Evaluation  Patient identified by MRN, date of birth, ID band Patient awake    Reviewed: Allergy & Precautions, NPO status , Patient's Chart, lab work & pertinent test results  Airway Mallampati: III  TM Distance: >3 FB Neck ROM: Full    Dental no notable dental hx.    Pulmonary neg pulmonary ROS,    Pulmonary exam normal        Cardiovascular negative cardio ROS Normal cardiovascular exam     Neuro/Psych  Headaches,    GI/Hepatic negative GI ROS, Neg liver ROS,   Endo/Other  negative endocrine ROS  Renal/GU negative Renal ROS     Musculoskeletal   Abdominal   Peds  Hematology negative hematology ROS (+)   Anesthesia Other Findings   Reproductive/Obstetrics (+) Pregnancy Suspected fetal macrosomia                             Anesthesia Physical Anesthesia Plan  ASA: II  Anesthesia Plan: Spinal   Post-op Pain Management:    Induction:   Airway Management Planned: Nasal Cannula  Additional Equipment:   Intra-op Plan:   Post-operative Plan:   Informed Consent:   Plan Discussed with: CRNA  Anesthesia Plan Comments:         Anesthesia Quick Evaluation

## 2015-10-01 NOTE — Progress Notes (Signed)
No change to H&P.  Micha Dosanjh, DO 

## 2015-10-02 LAB — CBC
HEMATOCRIT: 29 % — AB (ref 36.0–46.0)
Hemoglobin: 9.3 g/dL — ABNORMAL LOW (ref 12.0–15.0)
MCH: 27.8 pg (ref 26.0–34.0)
MCHC: 32.1 g/dL (ref 30.0–36.0)
MCV: 86.8 fL (ref 78.0–100.0)
Platelets: 187 10*3/uL (ref 150–400)
RBC: 3.34 MIL/uL — AB (ref 3.87–5.11)
RDW: 15.5 % (ref 11.5–15.5)
WBC: 11.3 10*3/uL — AB (ref 4.0–10.5)

## 2015-10-02 LAB — RPR: RPR Ser Ql: NONREACTIVE

## 2015-10-02 LAB — BIRTH TISSUE RECOVERY COLLECTION (PLACENTA DONATION)

## 2015-10-02 NOTE — Progress Notes (Signed)
Subjective: Postpartum Day 1: Cesarean Delivery Patient reports tolerating PO and no problems voiding.    Objective: Vital signs in last 24 hours: Temp:  [98 F (36.7 C)-98.9 F (37.2 C)] 98 F (36.7 C) (08/31 0640) Pulse Rate:  [64-114] 80 (08/31 0640) Resp:  [14-24] 18 (08/31 0640) BP: (104-133)/(55-88) 112/69 (08/31 0640) SpO2:  [97 %-100 %] 99 % (08/31 0245)  Physical Exam:  General: alert and cooperative Lochia: appropriate Uterine Fundus: firm Incision: healing well DVT Evaluation: No evidence of DVT seen on physical exam. Negative Homan's sign. No cords or calf tenderness. Calf/Ankle edema is present.   Recent Labs  10/01/15 1125 10/02/15 0501  HGB 11.2* 9.3*  HCT 35.0* 29.0*    Assessment/Plan: Status post Cesarean section. Doing well postoperatively.  Continue current care.  Kaedence Connelly G 10/02/2015, 7:56 AM

## 2015-10-02 NOTE — Lactation Note (Signed)
This note was copied from a baby's chart. Lactation Consultation Note  Patient Name: Gina Richard [id number] Date: 10/02/2015 Reason for consult: Initial assessment  Initial visit at 22 hours of life. Infant has fed multiple times in the 1st day of life. Infant currently sleeping in grandmother's arms. Mom to call for latch check.   Mom's questions about pumping & initial urinary/stool output answered.   Mom made aware of O/P services, breastfeeding support groups, community resources, and our phone # for post-discharge questions.   Matthias Hughs [facility] 10/02/2015, 11:33 AM

## 2015-10-02 NOTE — Anesthesia Postprocedure Evaluation (Signed)
Anesthesia Post Note  Patient: Gina Richard  Procedure(s) Performed: Procedure(s) (LRB): CESAREAN SECTION (N/A)  Patient location during evaluation: Mother Baby Anesthesia Type: Spinal Level of consciousness: oriented and awake and alert Pain management: pain level controlled Vital Signs Assessment: post-procedure vital signs reviewed and stable Respiratory status: spontaneous breathing and respiratory function stable Cardiovascular status: blood pressure returned to baseline and stable Postop Assessment: no headache and no backache Anesthetic complications: no     Last Vitals:  Vitals:   10/02/15 0245 10/02/15 0640  BP: 111/77 112/69  Pulse: 64 80  Resp: 20 18  Temp: 37.1 C 36.7 C    Last Pain:  Vitals:   10/02/15 0722  TempSrc:   PainSc: 1    Pain Goal: Patients Stated Pain Goal: 1 (10/02/15 0640)               [name]

## 2015-10-03 NOTE — Lactation Note (Signed)
This note was copied from a baby's chart. Lactation Consultation Note: Mother began supplementing with formula. Mother states that baby will latch on and off for only a few mins and then just crys. Mother states that she thinks that babys mouth is too small for her nipple.  Mother was sat up the DEBP and assist with pumping. #27 flange fit well. Mother has bilateral cracks and bruises .  Infant was given 31 ml of formula for the last feeding. Mother advised to page for lactation consultant to assist with feeding.   Patient Name: Gina Richard [id number] Date: 10/03/2015 Reason for consult: Follow-up assessment   Maternal Data    Feeding Feeding Type: Formula Nipple Type: Slow - flow Length of feed: 10 min  LATCH Score/Interventions                      Lactation Tools Discussed/Used     Consult Status      Darla Lesches 10/03/2015, 4:01 PM

## 2015-10-03 NOTE — Progress Notes (Signed)
Subjective: Postpartum Day 2: Cesarean Delivery Patient reports tolerating PO, + BM and no problems voiding.    Objective: Vital signs in last 24 hours: Temp:  [98.2 F (36.8 C)-99.3 F (37.4 C)] 98.2 F (36.8 C) (09/01 0523) Pulse Rate:  [65-90] 72 (09/01 0523) Resp:  [16-20] 18 (09/01 0523) BP: (118-130)/(66-77) 118/71 (09/01 0523) SpO2:  [99 %] 99 % (08/31 1050)  Physical Exam:  General: alert and cooperative Lochia: appropriate Uterine Fundus: firm Incision: healing well DVT Evaluation: No evidence of DVT seen on physical exam. Negative Homan's sign. No cords or calf tenderness. Calf/Ankle edema is present.   Recent Labs  10/01/15 1125 10/02/15 0501  HGB 11.2* 9.3*  HCT 35.0* 29.0*    Assessment/Plan: Status post Cesarean section. Doing well postoperatively.  Continue current care Plan discharge in am.  CURTIS,CAROL G 10/03/2015, 7:58 AM  Patient may go home this afternoon/evening if desires.  Lucillie Garfinkel MD

## 2015-10-03 NOTE — Lactation Note (Addendum)
This note was copied from a baby's chart. Lactation Consultation Note  Patient Name: Gina Richard [id number] Date: 10/03/2015 Reason for consult: Follow-up assessment  Mom called out for assist; infant fussy when I walked into room. I assisted w/putting "Gina Richard" to breast. Gina Richard latched w/relative ease, but there was no evidence of milk transfer (any intermittent swallows heard were likely saliva). Gina Richard did not become content with nursing.  Mom reports no increase in breast size during pregnancy (the only change reported was the darkening and broadening of her areola). Her breasts are also slightly wider-spaced & hypoplastic, especially in the lower quadrants. An honest discussion was had w/Mom about her potential for making a full supply. Mom was receptive and inquired about foods and herbs. I told Mom she could ask her OB about taking [employer] More Milk Special Blend (includes goat's rue, fenugreek, blessed thistle, etc.)  For the time being, Dad will feed formula via bottle & Mom will express her milk whenever Gina Richard receives formula. Paced bottle-feeding was taught & Dad is sensitive to infant's hunger & satiety cues. We also worked on hand expression & I provided her w/a colostrum container.   In anticipation of little breast milk being yielded over the next few days, regular formula was provided to ensure adequate lactose intake.   Instructions for Comfort Gels discussed & Mom assisted w/applying them.  Matthias Hughs [facility] 10/03/2015, 5:39 PM

## 2015-10-04 MED ORDER — IBUPROFEN 600 MG PO TABS: 600.0000 mg | ORAL_TABLET | Freq: Four times a day (QID) | ORAL | 0 refills | Status: DC

## 2015-10-04 MED ORDER — OXYCODONE-ACETAMINOPHEN 5-325 MG PO TABS: 1.0000 | ORAL_TABLET | ORAL | 0 refills | Status: DC | PRN

## 2015-10-04 MED ORDER — SENNOSIDES-DOCUSATE SODIUM 8.6-50 MG PO TABS: 1.0000 | ORAL_TABLET | ORAL | 0 refills | Status: DC

## 2015-10-04 NOTE — Discharge Summary (Signed)
Obstetric Discharge Summary Reason for Admission: cesarean section Prenatal Procedures: none Intrapartum Procedures: cesarean: low cervical, transverse Postpartum Procedures: none Complications-Operative and Postpartum: none Hemoglobin  Date Value Ref Range Status  10/02/2015 9.3 (L) 12.0 - 15.0 g/dL Final   HCT  Date Value Ref Range Status  10/02/2015 29.0 (L) 36.0 - 46.0 % Final    Physical Exam:  General: alert and cooperative Lochia: appropriate Uterine Fundus: firm Incision: healing well, no significant drainage, no dehiscence, no significant erythema DVT Evaluation: No evidence of DVT seen on physical exam. Negative Homan's sign. No cords or calf tenderness. Calf/Ankle edema is present.  Discharge Diagnoses: Term Pregnancy-delivered  Discharge Information: Date: 10/04/2015 Activity: pelvic rest Diet: routine Medications: Ibuprofen, Colace and Percocet Condition: stable Instructions: refer to practice specific booklet Discharge to: home   Newborn Data: Live born female  Birth Weight: 9 lb 1.7 oz (4130 g) APGAR: 9, 9  Home with mother.  Tyson Dense 10/04/2015, 9:55 AM

## 2015-10-04 NOTE — Lactation Note (Signed)
This note was copied from a baby's chart. Lactation Consultation Note  Patient Name: Gina Richard [id number] Date: 10/04/2015  Mom has been hand expressing drops and giving this back to baby.  She is also supplementing with formula due to weight loss and baby acting hungry after feeds.  She did some pumping yesterday but not during the night.  She has a DEBP at home.  Recommended putting baby to breast when possible and then post pump x 15 minutes then hand expression.  Answered questions.  Lactation outpatient services and support information reviewed and encouraged.   Maternal Data    Feeding Feeding Type: Formula Nipple Type: Slow - flow  LATCH Score/Interventions                      Lactation Tools Discussed/Used     Consult Status      Ave Filter 10/04/2015, 9:23 AM

## 2015-10-04 NOTE — Progress Notes (Signed)
Infant discharged with Mom Bracelet number [phone]

## 2015-11-03 DIAGNOSIS — Z1389 Encounter for screening for other disorder: Secondary | ICD-10-CM | POA: Diagnosis not present

## 2015-11-03 MED FILL — MONO-LINYAH 28 TABLET: 0.25-35 | 84 days supply | Qty: 84 | Fill #0

## 2016-01-06 MED FILL — METHYLPREDNISOLONE 4 MG TAB: 4 | 6 days supply | Qty: 21 | Fill #0

## 2016-01-06 MED FILL — levoFLOXacin 500 MG TABS: 500 | 10 days supply | Qty: 10 | Fill #0

## 2016-01-09 DIAGNOSIS — H04123 Dry eye syndrome of bilateral lacrimal glands: Secondary | ICD-10-CM | POA: Diagnosis not present

## 2016-01-09 DIAGNOSIS — H40053 Ocular hypertension, bilateral: Secondary | ICD-10-CM | POA: Diagnosis not present

## 2016-01-16 DIAGNOSIS — D485 Neoplasm of uncertain behavior of skin: Secondary | ICD-10-CM | POA: Diagnosis not present

## 2016-01-16 DIAGNOSIS — L74513 Primary focal hyperhidrosis, soles: Secondary | ICD-10-CM | POA: Diagnosis not present

## 2016-01-16 DIAGNOSIS — L732 Hidradenitis suppurativa: Secondary | ICD-10-CM | POA: Diagnosis not present

## 2016-01-16 DIAGNOSIS — D229 Melanocytic nevi, unspecified: Secondary | ICD-10-CM | POA: Diagnosis not present

## 2016-01-28 MED FILL — MONO-LINYAH 28 TABLET: 0.25-35 | 84 days supply | Qty: 84 | Fill #1

## 2016-03-04 DIAGNOSIS — H40053 Ocular hypertension, bilateral: Secondary | ICD-10-CM | POA: Diagnosis not present

## 2016-03-04 DIAGNOSIS — H04123 Dry eye syndrome of bilateral lacrimal glands: Secondary | ICD-10-CM | POA: Diagnosis not present

## 2016-04-15 MED FILL — MONO-LINYAH 28 TABLET: 0.25-35 | 84 days supply | Qty: 84 | Fill #2

## 2016-04-20 MED FILL — levoFLOXacin 500 MG TABS: 500 | 10 days supply | Qty: 10 | Fill #0

## 2016-04-20 MED FILL — METHYLPREDNISOLONE 4 MG TAB: 4 | 6 days supply | Qty: 21 | Fill #0

## 2016-07-12 MED FILL — MONO-LINYAH 28 TABLET: 0.25-35 | 84 days supply | Qty: 84 | Fill #3

## 2016-10-01 MED FILL — MONO-LINYAH 28 TABLET: 0.25-35 | 28 days supply | Qty: 28 | Fill #0

## 2016-10-28 MED FILL — NORG-ETHIN ESTRA 0.25-0.035: 0.25-35 | 28 days supply | Qty: 28 | Fill #1

## 2016-11-24 MED FILL — NORG-ETHIN ESTRA 0.25-0.035: 0.25-35 | 28 days supply | Qty: 28 | Fill #0

## 2016-12-21 MED FILL — NORG-ETHIN ESTRA 0.25-0.035: 0.25-35 | 28 days supply | Qty: 28 | Fill #1

## 2017-01-03 DIAGNOSIS — Z01419 Encounter for gynecological examination (general) (routine) without abnormal findings: Secondary | ICD-10-CM | POA: Diagnosis not present

## 2017-01-03 DIAGNOSIS — Z6841 Body Mass Index (BMI) 40.0 and over, adult: Secondary | ICD-10-CM | POA: Diagnosis not present

## 2017-01-04 ENCOUNTER — Other Ambulatory Visit (HOSPITAL_COMMUNITY): Payer: Self-pay | Admitting: Obstetrics & Gynecology

## 2017-01-04 ENCOUNTER — Other Ambulatory Visit: Payer: Self-pay | Admitting: Obstetrics & Gynecology

## 2017-01-04 DIAGNOSIS — R112 Nausea with vomiting, unspecified: Secondary | ICD-10-CM

## 2017-01-04 DIAGNOSIS — R1011 Right upper quadrant pain: Secondary | ICD-10-CM

## 2017-01-07 ENCOUNTER — Ambulatory Visit (HOSPITAL_COMMUNITY)
Admission: RE | Admit: 2017-01-07 | Discharge: 2017-01-07 | Disposition: A | Discharge status: Home / Self Care | Payer: 59 | Source: Ambulatory Visit | Attending: Obstetrics & Gynecology | Admitting: Obstetrics & Gynecology

## 2017-01-07 DIAGNOSIS — R160 Hepatomegaly, not elsewhere classified: Secondary | ICD-10-CM | POA: Diagnosis not present

## 2017-01-07 DIAGNOSIS — R1011 Right upper quadrant pain: Secondary | ICD-10-CM | POA: Diagnosis not present

## 2017-01-07 DIAGNOSIS — R112 Nausea with vomiting, unspecified: Secondary | ICD-10-CM | POA: Insufficient documentation

## 2017-01-13 ENCOUNTER — Other Ambulatory Visit (HOSPITAL_COMMUNITY): Payer: Self-pay | Admitting: Obstetrics & Gynecology

## 2017-01-13 DIAGNOSIS — K769 Liver disease, unspecified: Secondary | ICD-10-CM

## 2017-01-14 MED FILL — levoFLOXacin 500 MG TABS: 500 | 10 days supply | Qty: 10 | Fill #0

## 2017-01-19 MED FILL — FEMYNOR 0.25-35 MG-MCG TABS: 0.25-35 | 84 days supply | Qty: 84 | Fill #0

## 2017-02-04 ENCOUNTER — Ambulatory Visit (HOSPITAL_COMMUNITY): Payer: 59

## 2017-02-18 ENCOUNTER — Ambulatory Visit (HOSPITAL_COMMUNITY)
Admission: RE | Admit: 2017-02-18 | Discharge: 2017-02-18 | Disposition: A | Discharge status: Home / Self Care | Payer: 59 | Source: Ambulatory Visit | Attending: Obstetrics & Gynecology | Admitting: Obstetrics & Gynecology

## 2017-02-18 DIAGNOSIS — K769 Liver disease, unspecified: Secondary | ICD-10-CM | POA: Insufficient documentation

## 2017-02-18 DIAGNOSIS — R160 Hepatomegaly, not elsewhere classified: Secondary | ICD-10-CM | POA: Diagnosis not present

## 2017-02-18 DIAGNOSIS — K7689 Other specified diseases of liver: Secondary | ICD-10-CM | POA: Diagnosis not present

## 2017-02-18 DIAGNOSIS — K760 Fatty (change of) liver, not elsewhere classified: Secondary | ICD-10-CM | POA: Diagnosis not present

## 2017-02-18 MED ORDER — GADOBENATE DIMEGLUMINE 529 MG/ML IV SOLN
20.0000 mL | Freq: Once | INTRAVENOUS | Status: AC | PRN
  Administered 2017-02-18: 20 mL via INTRAVENOUS

## 2017-03-04 DIAGNOSIS — H47323 Drusen of optic disc, bilateral: Secondary | ICD-10-CM | POA: Diagnosis not present

## 2017-03-04 DIAGNOSIS — H40053 Ocular hypertension, bilateral: Secondary | ICD-10-CM | POA: Diagnosis not present

## 2017-03-04 DIAGNOSIS — H5213 Myopia, bilateral: Secondary | ICD-10-CM | POA: Diagnosis not present

## 2017-03-11 ENCOUNTER — Other Ambulatory Visit (HOSPITAL_COMMUNITY)
Admission: RE | Admit: 2017-03-11 | Discharge: 2017-03-11 | Disposition: A | Discharge status: Home / Self Care | Payer: 59 | Source: Ambulatory Visit | Attending: Pulmonary Disease | Admitting: Pulmonary Disease

## 2017-03-11 DIAGNOSIS — K769 Liver disease, unspecified: Secondary | ICD-10-CM | POA: Diagnosis not present

## 2017-03-11 DIAGNOSIS — R112 Nausea with vomiting, unspecified: Secondary | ICD-10-CM | POA: Insufficient documentation

## 2017-03-11 DIAGNOSIS — R109 Unspecified abdominal pain: Secondary | ICD-10-CM | POA: Diagnosis not present

## 2017-03-11 LAB — LIPID PANEL
CHOL/HDL RATIO: 3.8 ratio
CHOLESTEROL: 133 mg/dL (ref 0–200)
HDL: 35 mg/dL — ABNORMAL LOW (ref >40)
LDL Cholesterol: 71 mg/dL (ref 0–99)
Triglycerides: 133 mg/dL (ref <150)
VLDL: 27 mg/dL (ref 0–40)

## 2017-03-11 LAB — HEPATIC FUNCTION PANEL
ALBUMIN: 3.7 g/dL (ref 3.5–5.0)
ALK PHOS: 74 U/L (ref 38–126)
ALT: 29 U/L (ref 14–54)
AST: 23 U/L (ref 15–41)
BILIRUBIN INDIRECT: 0.2 mg/dL — AB (ref 0.3–0.9)
BILIRUBIN TOTAL: 0.3 mg/dL (ref 0.3–1.2)
Bilirubin, Direct: 0.1 mg/dL (ref 0.1–0.5)
TOTAL PROTEIN: 7.2 g/dL (ref 6.5–8.1)

## 2017-03-12 LAB — AFP TUMOR MARKER: AFP, SERUM, TUMOR MARKER: 2.2 ng/mL (ref 0.0–8.3)

## 2018-01-05 DIAGNOSIS — Z6841 Body Mass Index (BMI) 40.0 and over, adult: Secondary | ICD-10-CM | POA: Diagnosis not present

## 2018-01-05 DIAGNOSIS — Z01419 Encounter for gynecological examination (general) (routine) without abnormal findings: Secondary | ICD-10-CM | POA: Diagnosis not present

## 2018-11-20 DIAGNOSIS — N911 Secondary amenorrhea: Secondary | ICD-10-CM | POA: Diagnosis not present

## 2018-11-27 DIAGNOSIS — Z113 Encounter for screening for infections with a predominantly sexual mode of transmission: Secondary | ICD-10-CM | POA: Diagnosis not present

## 2018-11-27 DIAGNOSIS — Z3482 Encounter for supervision of other normal pregnancy, second trimester: Secondary | ICD-10-CM | POA: Diagnosis not present

## 2018-11-27 DIAGNOSIS — Z3400 Encounter for supervision of normal first pregnancy, unspecified trimester: Secondary | ICD-10-CM | POA: Diagnosis not present

## 2018-11-27 DIAGNOSIS — Z3481 Encounter for supervision of other normal pregnancy, first trimester: Secondary | ICD-10-CM | POA: Diagnosis not present

## 2018-11-27 DIAGNOSIS — Z3685 Encounter for antenatal screening for Streptococcus B: Secondary | ICD-10-CM | POA: Diagnosis not present

## 2018-11-27 LAB — OB RESULTS CONSOLE RUBELLA ANTIBODY, IGM: Rubella: IMMUNE

## 2018-11-27 LAB — OB RESULTS CONSOLE ABO/RH: RH Type: POSITIVE

## 2018-11-27 LAB — OB RESULTS CONSOLE HIV ANTIBODY (ROUTINE TESTING): HIV: NONREACTIVE

## 2018-11-27 LAB — OB RESULTS CONSOLE GC/CHLAMYDIA
Chlamydia: NEGATIVE
Gonorrhea: NEGATIVE

## 2018-11-27 LAB — OB RESULTS CONSOLE ANTIBODY SCREEN: Antibody Screen: NEGATIVE

## 2018-11-27 LAB — OB RESULTS CONSOLE HEPATITIS B SURFACE ANTIGEN: Hepatitis B Surface Ag: NEGATIVE

## 2018-11-27 LAB — OB RESULTS CONSOLE RPR: RPR: NONREACTIVE

## 2018-12-06 DIAGNOSIS — Z3400 Encounter for supervision of normal first pregnancy, unspecified trimester: Secondary | ICD-10-CM | POA: Diagnosis not present

## 2018-12-06 DIAGNOSIS — Z113 Encounter for screening for infections with a predominantly sexual mode of transmission: Secondary | ICD-10-CM | POA: Diagnosis not present

## 2019-01-02 DIAGNOSIS — Z3682 Encounter for antenatal screening for nuchal translucency: Secondary | ICD-10-CM | POA: Diagnosis not present

## 2019-01-02 DIAGNOSIS — Z3A12 12 weeks gestation of pregnancy: Secondary | ICD-10-CM | POA: Diagnosis not present

## 2019-01-04 DIAGNOSIS — Z3A13 13 weeks gestation of pregnancy: Secondary | ICD-10-CM | POA: Diagnosis not present

## 2019-01-04 DIAGNOSIS — Z36 Encounter for antenatal screening for chromosomal anomalies: Secondary | ICD-10-CM | POA: Diagnosis not present

## 2019-04-24 MED FILL — FREESTYLE LITE METER: 30 days supply | Qty: 1 | Fill #0

## 2019-04-24 MED FILL — FREESTYLE LITE TEST STRIP: 25 days supply | Qty: 100 | Fill #0

## 2019-04-24 MED FILL — FREESTYLE LANCETS: 25 days supply | Qty: 100 | Fill #0

## 2019-04-25 ENCOUNTER — Encounter: Payer: No Typology Code available for payment source | Attending: Obstetrics & Gynecology | Admitting: Registered"

## 2019-04-25 ENCOUNTER — Other Ambulatory Visit: Payer: Self-pay

## 2019-04-25 DIAGNOSIS — O99810 Abnormal glucose complicating pregnancy: Secondary | ICD-10-CM | POA: Insufficient documentation

## 2019-04-27 ENCOUNTER — Encounter: Payer: Self-pay | Admitting: Registered"

## 2019-04-27 DIAGNOSIS — O99810 Abnormal glucose complicating pregnancy: Secondary | ICD-10-CM | POA: Insufficient documentation

## 2019-04-27 NOTE — Progress Notes (Signed)
Patient was seen on 04/25/19 for Gestational Diabetes self-management class at [facility]. The following learning objectives were met by the patient during this course:   States the definition of Gestational Diabetes  States why dietary management is important in controlling blood glucose  Describes the effects each nutrient has on blood glucose levels  Demonstrates ability to create a balanced meal plan  Demonstrates carbohydrate counting   States when to check blood glucose levels  Demonstrates proper blood glucose monitoring techniques  States the effect of stress and exercise on blood glucose levels  States the importance of limiting caffeine and abstaining from alcohol and smoking  Blood glucose monitor given: none- [employer] Employee brought [employer] to class with her Blood glucose reading: 91 mg/dL  Patient instructed to monitor glucose levels: FBS: 60 - <95; 1 hour: <140; 2 hour: <120  Patient received handouts:  Nutrition Diabetes and Pregnancy, including carb counting list  Patient will be seen for follow-up as needed.

## 2019-05-07 MED FILL — glyBURIDE 2.5 MG TABS: 2.5 | 30 days supply | Qty: 30 | Fill #0

## 2019-05-21 MED FILL — FREESTYLE LITE TEST STRIP: 25 days supply | Qty: 100 | Fill #1

## 2019-05-21 MED FILL — FREESTYLE LANCETS: 25 days supply | Qty: 100 | Fill #1

## 2019-06-06 MED FILL — glyBURIDE 2.5 MG TABS: 2.5 | 30 days supply | Qty: 30 | Fill #1

## 2019-06-14 LAB — OB RESULTS CONSOLE GBS: GBS: NEGATIVE

## 2019-06-21 ENCOUNTER — Telehealth (HOSPITAL_COMMUNITY): Payer: Self-pay | Admitting: *Deleted

## 2019-06-21 ENCOUNTER — Encounter (HOSPITAL_COMMUNITY): Payer: Self-pay | Admitting: *Deleted

## 2019-06-21 NOTE — Telephone Encounter (Signed)
Preadmission screen  

## 2019-06-21 NOTE — Patient Instructions (Signed)
Gina Richard  06/21/2019   Your procedure is scheduled on:  07/05/2019  Arrive at [place] at Entrance C on [address] at [facility]  and [address]. You are invited to use the FREE valet parking or use the Visitor's parking deck.  Pick up the phone at the desk and dial [phone].  Call this number if you have problems the morning of surgery: [phone]  Remember:   Do not eat food:(After Midnight) Desps de medianoche.  Do not drink clear liquids: (After Midnight) Desps de medianoche.  Take these medicines the morning of surgery with A SIP OF WATER:  none   Do not wear jewelry, make-up or nail polish.  Do not wear lotions, powders, or perfumes. Do not wear deodorant.  Do not shave 48 hours prior to surgery.  Do not bring valuables to the hospital.  [facility] is not   responsible for any belongings or valuables brought to the hospital.  Contacts, dentures or bridgework may not be worn into surgery.  Leave suitcase in the car. After surgery it may be brought to your room.  For patients admitted to the hospital, checkout time is 11:00 AM the day of              discharge.      Please read over the following fact sheets that you were given:     Preparing for Surgery

## 2019-06-22 ENCOUNTER — Encounter (HOSPITAL_COMMUNITY): Payer: Self-pay

## 2019-07-03 ENCOUNTER — Other Ambulatory Visit (HOSPITAL_COMMUNITY): Payer: No Typology Code available for payment source

## 2019-07-03 ENCOUNTER — Other Ambulatory Visit: Payer: Self-pay

## 2019-07-03 ENCOUNTER — Other Ambulatory Visit (HOSPITAL_COMMUNITY)
Admission: RE | Admit: 2019-07-03 | Discharge: 2019-07-03 | Disposition: A | Discharge status: Home / Self Care | Payer: No Typology Code available for payment source | Source: Ambulatory Visit | Attending: Obstetrics and Gynecology | Admitting: Obstetrics and Gynecology

## 2019-07-03 DIAGNOSIS — Z20822 Contact with and (suspected) exposure to covid-19: Secondary | ICD-10-CM | POA: Insufficient documentation

## 2019-07-03 DIAGNOSIS — Z01812 Encounter for preprocedural laboratory examination: Secondary | ICD-10-CM | POA: Insufficient documentation

## 2019-07-03 HISTORY — DX: Gestational diabetes mellitus in pregnancy, unspecified control: O24.419

## 2019-07-03 HISTORY — DX: Liver disease, unspecified: K76.9

## 2019-07-03 LAB — TYPE AND SCREEN
ABO/RH(D): A POS
Antibody Screen: NEGATIVE

## 2019-07-03 LAB — CBC
HCT: 35.6 % — ABNORMAL LOW (ref 36.0–46.0)
Hemoglobin: 11.1 g/dL — ABNORMAL LOW (ref 12.0–15.0)
MCH: 28.4 pg (ref 26.0–34.0)
MCHC: 31.2 g/dL (ref 30.0–36.0)
MCV: 91 fL (ref 80.0–100.0)
Platelets: 196 10*3/uL (ref 150–400)
RBC: 3.91 MIL/uL (ref 3.87–5.11)
RDW: 14.9 % (ref 11.5–15.5)
WBC: 9.3 10*3/uL (ref 4.0–10.5)
nRBC: 0 % (ref 0.0–0.2)

## 2019-07-03 LAB — RPR: RPR Ser Ql: NONREACTIVE

## 2019-07-03 LAB — SARS CORONAVIRUS 2 (TAT 6-24 HRS): SARS Coronavirus 2: NEGATIVE

## 2019-07-03 LAB — ABO/RH: ABO/RH(D): A POS

## 2019-07-03 NOTE — H&P (Deleted)
  The note originally documented on this encounter has been moved the the encounter in which it belongs.  

## 2019-07-03 NOTE — MAU Note (Signed)
Asymptomatic, swab collected. Blood work drawn.

## 2019-07-03 NOTE — H&P (Signed)
Gina Richard is a 29 y.o. female G2P1001 at 28 weeks presenting for repeat C/S; declines TOLAC.  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  She has lesion of liver found on MRI but never went for f/u; asymptomatic.  GBS negative.    OB History    Gravida  2   Para  1   Term  1   Preterm      AB      Living  1     SAB      TAB      Ectopic      Multiple  0   Live Births  1          Past Medical History:  Diagnosis Date  . Gestational diabetes   . Headache   . Hepatic lesion    on a MRI, never went for followup  . Hx of varicella    Past Surgical History:  Procedure Laterality Date  . CESAREAN SECTION N/A 10/01/2015   Procedure: CESAREAN SECTION;  Surgeon: Linda Hedges, DO;  Location: [employer];  Service: Obstetrics;  Laterality: N/A;  Primary edc 10/02/15 NKDA  . KNEE SURGERY     Family History: family history includes Cancer in her maternal grandfather, maternal grandmother, and paternal grandmother; Diabetes in her paternal grandmother; Heart disease in her paternal grandfather; Hypertension in her mother; Parkinson's disease in her paternal grandmother; Thyroid disease in her maternal grandmother. Social History:  reports that she has never smoked. She has never used smokeless tobacco. She reports that she does not drink alcohol or use drugs.     Maternal Diabetes: Yes:  Diabetes Type:  Insulin/Medication controlled Genetic Screening: Normal Maternal Ultrasounds/Referrals: Normal Fetal Ultrasounds or other Referrals:  None Maternal Substance Abuse:  No Significant Maternal Medications:  Meds include: Other: Glyburide Significant Maternal Lab Results:  Group B Strep negative Other Comments:  None  Review of Systems Maternal Medical History:  Prenatal complications: no prenatal complications Prenatal Complications - Diabetes: gestational. Diabetes is managed by oral agent (monotherapy).        unknown if currently  breastfeeding. Maternal Exam:  Abdomen: Patient reports no abdominal tenderness. Surgical scars: low transverse.     Physical Exam  Constitutional: She is oriented to person, place, and time. She appears well-developed and well-nourished.  Respiratory: Effort normal.  GI: Soft. There is no rebound and no guarding.  Musculoskeletal:        General: Normal range of motion.     Cervical back: Normal range of motion.  Neurological: She is alert and oriented to person, place, and time.  Skin: Skin is warm and dry.  Psychiatric: She has a normal mood and affect. Her behavior is normal.    Prenatal labs: ABO, Rh: --/--/A POS (06/01 [id number]) Antibody: NEG (06/01 [id number]) Rubella: Immune (10/26 0000) RPR: Nonreactive (10/26 0000)  HBsAg: Negative (10/26 0000)  HIV: Non-reactive (10/26 0000)  GBS: Negative/-- (05/13 0000)   Assessment/Plan: 29yo G2P1001 at 39 weeks for repeat C/S Patient has been counseled re: risk of bleeding, infection, scarring, and damage to surrounding structures.  She understands implications in future pregnancies including increased risk of uterine rupture as well as abnormal placentation.  All questions were answered and the patient wishes to proceed.   Linda Hedges 07/03/2019, 9:33 AM

## 2019-07-04 NOTE — Anesthesia Preprocedure Evaluation (Addendum)
Anesthesia Evaluation  Patient identified by MRN, date of birth, ID band Patient awake    Reviewed: Allergy & Precautions, NPO status , Patient's Chart, lab work & pertinent test results  Airway Mallampati: III  TM Distance: >3 FB Neck ROM: Full    Dental no notable dental hx. (+) Teeth Intact, Dental Advisory Given   Pulmonary neg pulmonary ROS,    Pulmonary exam normal breath sounds clear to auscultation       Cardiovascular negative cardio ROS Normal cardiovascular exam Rhythm:Regular Rate:Normal     Neuro/Psych  Headaches, negative psych ROS   GI/Hepatic negative GI ROS, Neg liver ROS,   Endo/Other  diabetes, Well Controlled, Gestational, Oral Hypoglycemic AgentsMorbid obesityBMI 48  Renal/GU negative Renal ROS  negative genitourinary   Musculoskeletal negative musculoskeletal ROS (+)   Abdominal (+) + obese,   Peds negative pediatric ROS (+)  Hematology  (+) Blood dyscrasia, anemia , hct 35.6, plt 196   Anesthesia Other Findings   Reproductive/Obstetrics (+) Pregnancy One prior c section                           Anesthesia Physical Anesthesia Plan  ASA: III  Anesthesia Plan: Spinal   Post-op Pain Management:    Induction:   PONV Risk Score and Plan: 3 and Ondansetron, Dexamethasone and Treatment may vary due to age or medical condition  Airway Management Planned: Natural Airway and Nasal Cannula  Additional Equipment: None  Intra-op Plan:   Post-operative Plan:   Informed Consent: I have reviewed the patients History and Physical, chart, labs and discussed the procedure including the risks, benefits and alternatives for the proposed anesthesia with the patient or authorized representative who has indicated his/her understanding and acceptance.       Plan Discussed with: CRNA  Anesthesia Plan Comments:        Anesthesia Quick Evaluation

## 2019-07-05 ENCOUNTER — Encounter (HOSPITAL_COMMUNITY)
Admission: RE | Disposition: A | Discharge status: Home / Self Care | Payer: Self-pay | Source: Other Acute Inpatient Hospital | Attending: Obstetrics & Gynecology

## 2019-07-05 ENCOUNTER — Other Ambulatory Visit: Payer: Self-pay

## 2019-07-05 ENCOUNTER — Inpatient Hospital Stay (HOSPITAL_COMMUNITY)
Admission: RE | Admit: 2019-07-05 | Payer: No Typology Code available for payment source | Source: Home / Self Care | Admitting: Obstetrics & Gynecology

## 2019-07-05 ENCOUNTER — Inpatient Hospital Stay (HOSPITAL_COMMUNITY): Payer: No Typology Code available for payment source | Admitting: Anesthesiology

## 2019-07-05 ENCOUNTER — Inpatient Hospital Stay (HOSPITAL_COMMUNITY)
Admission: RE | Admit: 2019-07-05 | Discharge: 2019-07-07 | DRG: 788 | Disposition: A | Discharge status: Home / Self Care | Payer: No Typology Code available for payment source | Source: Other Acute Inpatient Hospital | Attending: Obstetrics & Gynecology | Admitting: Obstetrics & Gynecology

## 2019-07-05 ENCOUNTER — Encounter (HOSPITAL_COMMUNITY): Payer: Self-pay | Admitting: Obstetrics & Gynecology

## 2019-07-05 DIAGNOSIS — Z3A39 39 weeks gestation of pregnancy: Secondary | ICD-10-CM | POA: Diagnosis not present

## 2019-07-05 DIAGNOSIS — Z20822 Contact with and (suspected) exposure to covid-19: Secondary | ICD-10-CM | POA: Diagnosis present

## 2019-07-05 DIAGNOSIS — O99214 Obesity complicating childbirth: Secondary | ICD-10-CM | POA: Diagnosis present

## 2019-07-05 DIAGNOSIS — O24425 Gestational diabetes mellitus in childbirth, controlled by oral hypoglycemic drugs: Secondary | ICD-10-CM | POA: Diagnosis present

## 2019-07-05 DIAGNOSIS — Z98891 History of uterine scar from previous surgery: Secondary | ICD-10-CM

## 2019-07-05 DIAGNOSIS — O34211 Maternal care for low transverse scar from previous cesarean delivery: Principal | ICD-10-CM | POA: Diagnosis present

## 2019-07-05 LAB — GLUCOSE, CAPILLARY
Glucose-Capillary: 71 mg/dL (ref 70–99)
Glucose-Capillary: 77 mg/dL (ref 70–99)

## 2019-07-05 SURGERY — Surgical Case
Anesthesia: Spinal | Site: Abdomen | Wound class: Clean Contaminated

## 2019-07-05 MED ORDER — HYDROMORPHONE HCL 1 MG/ML IJ SOLN: 0.2500 mg | INTRAMUSCULAR | Status: DC | PRN

## 2019-07-05 MED ORDER — NALBUPHINE HCL 10 MG/ML IJ SOLN: 5.0000 mg | INTRAMUSCULAR | Status: DC | PRN

## 2019-07-05 MED ORDER — NALOXONE HCL 0.4 MG/ML IJ SOLN: 0.4000 mg | INTRAMUSCULAR | Status: DC | PRN

## 2019-07-05 MED ORDER — SIMETHICONE 80 MG PO CHEW
80.0000 mg | CHEWABLE_TABLET | Freq: Three times a day (TID) | ORAL | Status: DC
  Administered 2019-07-05 – 2019-07-07 (×6): 80 mg via ORAL
  Filled 2019-07-05 (×6): qty 1

## 2019-07-05 MED ORDER — IBUPROFEN 800 MG PO TABS
800.0000 mg | ORAL_TABLET | Freq: Four times a day (QID) | ORAL | Status: DC
  Administered 2019-07-05 – 2019-07-07 (×7): 800 mg via ORAL
  Filled 2019-07-05 (×7): qty 1

## 2019-07-05 MED ORDER — PHENYLEPHRINE HCL-NACL 20-0.9 MG/250ML-% IV SOLN
INTRAVENOUS | Status: DC | PRN
  Administered 2019-07-05: 70 ug/min via INTRAVENOUS

## 2019-07-05 MED ORDER — KETOROLAC TROMETHAMINE 30 MG/ML IJ SOLN: 30.0000 mg | Freq: Four times a day (QID) | INTRAMUSCULAR | Status: AC | PRN

## 2019-07-05 MED ORDER — NALOXONE HCL 4 MG/10ML IJ SOLN
1.0000 ug/kg/h | INTRAVENOUS | Status: DC | PRN
  Filled 2019-07-05: qty 5

## 2019-07-05 MED ORDER — MEPERIDINE HCL 25 MG/ML IJ SOLN: 6.2500 mg | INTRAMUSCULAR | Status: DC | PRN

## 2019-07-05 MED ORDER — TETANUS-DIPHTH-ACELL PERTUSSIS 5-2.5-18.5 LF-MCG/0.5 IM SUSP: 0.5000 mL | Freq: Once | INTRAMUSCULAR | Status: DC

## 2019-07-05 MED ORDER — PROMETHAZINE HCL 25 MG/ML IJ SOLN: 6.2500 mg | INTRAMUSCULAR | Status: DC | PRN

## 2019-07-05 MED ORDER — ZOLPIDEM TARTRATE 5 MG PO TABS: 5.0000 mg | ORAL_TABLET | Freq: Every evening | ORAL | Status: DC | PRN

## 2019-07-05 MED ORDER — SIMETHICONE 80 MG PO CHEW: 80.0000 mg | CHEWABLE_TABLET | ORAL | Status: DC | PRN

## 2019-07-05 MED ORDER — IBUPROFEN 800 MG PO TABS: 800.0000 mg | ORAL_TABLET | Freq: Four times a day (QID) | ORAL | Status: DC

## 2019-07-05 MED ORDER — DEXTROSE 5 % IV SOLN
3.0000 g | INTRAVENOUS | Status: AC
  Administered 2019-07-05: 3 g via INTRAVENOUS

## 2019-07-05 MED ORDER — ACETAMINOPHEN 500 MG PO TABS
1000.0000 mg | ORAL_TABLET | Freq: Four times a day (QID) | ORAL | Status: AC
  Administered 2019-07-05 – 2019-07-06 (×3): 1000 mg via ORAL
  Filled 2019-07-05 (×3): qty 2

## 2019-07-05 MED ORDER — DIBUCAINE (PERIANAL) 1 % EX OINT: 1.0000 "application " | TOPICAL_OINTMENT | CUTANEOUS | Status: DC | PRN

## 2019-07-05 MED ORDER — DIPHENHYDRAMINE HCL 50 MG/ML IJ SOLN
12.5000 mg | Freq: Once | INTRAMUSCULAR | Status: AC
  Administered 2019-07-05: 12.5 mg via INTRAVENOUS

## 2019-07-05 MED ORDER — OXYCODONE HCL 5 MG PO TABS: 5.0000 mg | ORAL_TABLET | Freq: Once | ORAL | Status: DC | PRN

## 2019-07-05 MED ORDER — ACETAMINOPHEN 325 MG PO TABS
650.0000 mg | ORAL_TABLET | ORAL | Status: DC | PRN
  Administered 2019-07-07: 650 mg via ORAL
  Filled 2019-07-05: qty 2

## 2019-07-05 MED ORDER — DIPHENHYDRAMINE HCL 25 MG PO CAPS: 25.0000 mg | ORAL_CAPSULE | ORAL | Status: DC | PRN

## 2019-07-05 MED ORDER — ONDANSETRON HCL 4 MG/2ML IJ SOLN
INTRAMUSCULAR | Status: DC | PRN
  Administered 2019-07-05: 4 mg via INTRAVENOUS

## 2019-07-05 MED ORDER — SCOPOLAMINE 1 MG/3DAYS TD PT72: 1.0000 | MEDICATED_PATCH | Freq: Once | TRANSDERMAL | Status: DC

## 2019-07-05 MED ORDER — OXYCODONE-ACETAMINOPHEN 5-325 MG PO TABS: 1.0000 | ORAL_TABLET | ORAL | Status: DC | PRN

## 2019-07-05 MED ORDER — LACTATED RINGERS IV SOLN: INTRAVENOUS | Status: DC

## 2019-07-05 MED ORDER — NALBUPHINE HCL 10 MG/ML IJ SOLN: 5.0000 mg | Freq: Once | INTRAMUSCULAR | Status: DC | PRN

## 2019-07-05 MED ORDER — BUPIVACAINE IN DEXTROSE 0.75-8.25 % IT SOLN
INTRATHECAL | Status: DC | PRN
  Administered 2019-07-05: 1.4 mL via INTRATHECAL

## 2019-07-05 MED ORDER — OXYTOCIN-SODIUM CHLORIDE 30-0.9 UT/500ML-% IV SOLN
2.5000 [IU]/h | INTRAVENOUS | Status: AC
  Administered 2019-07-05: 2.5 [IU]/h via INTRAVENOUS
  Filled 2019-07-05: qty 500

## 2019-07-05 MED ORDER — SODIUM CHLORIDE 0.9% FLUSH: 3.0000 mL | INTRAVENOUS | Status: DC | PRN

## 2019-07-05 MED ORDER — SODIUM CHLORIDE 0.9 % IV SOLN: INTRAVENOUS | Status: DC | PRN

## 2019-07-05 MED ORDER — MORPHINE SULFATE (PF) 0.5 MG/ML IJ SOLN
INTRAMUSCULAR | Status: DC | PRN
  Administered 2019-07-05: .15 ug via INTRATHECAL

## 2019-07-05 MED ORDER — KETOROLAC TROMETHAMINE 30 MG/ML IJ SOLN
30.0000 mg | Freq: Once | INTRAMUSCULAR | Status: AC | PRN
  Administered 2019-07-05: 30 mg via INTRAVENOUS

## 2019-07-05 MED ORDER — DIPHENHYDRAMINE HCL 25 MG PO CAPS: 25.0000 mg | ORAL_CAPSULE | Freq: Four times a day (QID) | ORAL | Status: DC | PRN

## 2019-07-05 MED ORDER — COCONUT OIL OIL: 1.0000 "application " | TOPICAL_OIL | Status: DC | PRN

## 2019-07-05 MED ORDER — SIMETHICONE 80 MG PO CHEW
80.0000 mg | CHEWABLE_TABLET | ORAL | Status: DC
  Administered 2019-07-05 – 2019-07-06 (×2): 80 mg via ORAL
  Filled 2019-07-05 (×2): qty 1

## 2019-07-05 MED ORDER — STERILE WATER FOR IRRIGATION IR SOLN
Status: DC | PRN
  Administered 2019-07-05: 1

## 2019-07-05 MED ORDER — WITCH HAZEL-GLYCERIN EX PADS: 1.0000 "application " | MEDICATED_PAD | CUTANEOUS | Status: DC | PRN

## 2019-07-05 MED ORDER — MENTHOL 3 MG MT LOZG: 1.0000 | LOZENGE | OROMUCOSAL | Status: DC | PRN

## 2019-07-05 MED ORDER — SENNOSIDES-DOCUSATE SODIUM 8.6-50 MG PO TABS
2.0000 | ORAL_TABLET | ORAL | Status: DC
  Administered 2019-07-05 – 2019-07-06 (×2): 2 via ORAL
  Filled 2019-07-05 (×2): qty 2

## 2019-07-05 MED ORDER — OXYTOCIN-SODIUM CHLORIDE 30-0.9 UT/500ML-% IV SOLN
INTRAVENOUS | Status: DC | PRN
  Administered 2019-07-05: 30 mL via INTRAVENOUS

## 2019-07-05 MED ORDER — SODIUM CHLORIDE 0.9 % IR SOLN
Status: DC | PRN
  Administered 2019-07-05: 1

## 2019-07-05 MED ORDER — PRENATAL MULTIVITAMIN CH
1.0000 | ORAL_TABLET | Freq: Every day | ORAL | Status: DC
  Administered 2019-07-06: 1 via ORAL
  Filled 2019-07-05: qty 1

## 2019-07-05 MED ORDER — DIPHENHYDRAMINE HCL 50 MG/ML IJ SOLN
12.5000 mg | INTRAMUSCULAR | Status: DC | PRN
  Administered 2019-07-05: 12.5 mg via INTRAVENOUS
  Filled 2019-07-05: qty 1

## 2019-07-05 MED ORDER — OXYCODONE HCL 5 MG/5ML PO SOLN: 5.0000 mg | Freq: Once | ORAL | Status: DC | PRN

## 2019-07-05 MED ORDER — FENTANYL CITRATE (PF) 100 MCG/2ML IJ SOLN
INTRAMUSCULAR | Status: DC | PRN
  Administered 2019-07-05: 15 ug via INTRATHECAL

## 2019-07-05 MED ORDER — ONDANSETRON HCL 4 MG/2ML IJ SOLN: 4.0000 mg | Freq: Three times a day (TID) | INTRAMUSCULAR | Status: DC | PRN

## 2019-07-05 SURGICAL SUPPLY — 38 items
ADH SKN CLS APL DERMABOND .7 (GAUZE/BANDAGES/DRESSINGS)
APL SKNCLS STERI-STRIP NONHPOA (GAUZE/BANDAGES/DRESSINGS) ×1
BENZOIN TINCTURE PRP APPL 2/3 (GAUZE/BANDAGES/DRESSINGS) ×3 IMPLANT
CHLORAPREP W/TINT 26ML (MISCELLANEOUS) ×3 IMPLANT
CLAMP CORD UMBIL (MISCELLANEOUS) IMPLANT
CLOSURE STERI STRIP 1/2 X4 (GAUZE/BANDAGES/DRESSINGS) ×2 IMPLANT
CLOSURE WOUND 1/2 X4 (GAUZE/BANDAGES/DRESSINGS)
CLOTH BEACON ORANGE TIMEOUT ST (SAFETY) ×3 IMPLANT
DERMABOND ADVANCED (GAUZE/BANDAGES/DRESSINGS)
DERMABOND ADVANCED .7 DNX12 (GAUZE/BANDAGES/DRESSINGS) IMPLANT
DRSG OPSITE POSTOP 4X10 (GAUZE/BANDAGES/DRESSINGS) ×3 IMPLANT
ELECT REM PT RETURN 9FT ADLT (ELECTROSURGICAL) ×3
ELECTRODE REM PT RTRN 9FT ADLT (ELECTROSURGICAL) ×1 IMPLANT
EXTRACTOR VACUUM KIWI (MISCELLANEOUS) ×2 IMPLANT
GLOVE BIO SURGEON STRL SZ 6 (GLOVE) ×3 IMPLANT
GLOVE BIOGEL PI IND STRL 6 (GLOVE) ×2 IMPLANT
GLOVE BIOGEL PI IND STRL 7.0 (GLOVE) ×1 IMPLANT
GLOVE BIOGEL PI INDICATOR 6 (GLOVE) ×4
GLOVE BIOGEL PI INDICATOR 7.0 (GLOVE) ×2
GOWN STRL REUS W/TWL LRG LVL3 (GOWN DISPOSABLE) ×6 IMPLANT
KIT ABG SYR 3ML LUER SLIP (SYRINGE) ×3 IMPLANT
NDL HYPO 25X5/8 SAFETYGLIDE (NEEDLE) ×1 IMPLANT
NEEDLE HYPO 25X5/8 SAFETYGLIDE (NEEDLE) ×3 IMPLANT
NS IRRIG 1000ML POUR BTL (IV SOLUTION) ×3 IMPLANT
PACK C SECTION WH (CUSTOM PROCEDURE TRAY) ×3 IMPLANT
PAD OB MATERNITY 4.3X12.25 (PERSONAL CARE ITEMS) ×3 IMPLANT
PENCIL SMOKE EVAC W/HOLSTER (ELECTROSURGICAL) ×3 IMPLANT
STRIP CLOSURE SKIN 1/2X4 (GAUZE/BANDAGES/DRESSINGS) IMPLANT
SUT CHROMIC 0 CTX 36 (SUTURE) ×9 IMPLANT
SUT MON AB 2-0 CT1 27 (SUTURE) ×3 IMPLANT
SUT PDS AB 0 CT1 27 (SUTURE) IMPLANT
SUT PLAIN 0 NONE (SUTURE) IMPLANT
SUT PLAIN 2 0 XLH (SUTURE) ×2 IMPLANT
SUT VIC AB 0 CT1 36 (SUTURE) IMPLANT
SUT VIC AB 4-0 KS 27 (SUTURE) IMPLANT
TOWEL OR 17X24 6PK STRL BLUE (TOWEL DISPOSABLE) ×3 IMPLANT
TRAY FOLEY W/BAG SLVR 14FR LF (SET/KITS/TRAYS/PACK) IMPLANT
WATER STERILE IRR 1000ML POUR (IV SOLUTION) ×3 IMPLANT

## 2019-07-05 NOTE — Lactation Note (Signed)
This note was copied from a baby's chart. Lactation Consultation Note  Patient Name: Gina Richard [id number] Date: 07/05/2019 Reason for consult: Initial assessment;Term P2, 67 hour female infant. Mom's hx: GBM( insulin) Glyburide -L2 safe with breastfeeding, repeat C/S delivery.  Infant had 3 stools and 2 voids diapers since birth. Parents current feeding choice is breast and formula feeding, per mom, she will stop giving infant formula for now, she requested formula because she thought she did not have any breast milk to give infant. LC discussed infant's small tummy size the first few days of life.  Mom is concern infant is not getting any colostrum, past hx she tried BF her child but stopped due low milk supply and she was told she had insufficient gland tissue. Per mom, she did see changes in breast, breast got larger, darker and mom does have montgomery glands present.   LC discussed hand expression and mom expressed 3 mls of colostrum that was spoon fed to infant. Mom was pleased to see she has colostrum present in breast within first 24 hours she did not see that with her 1st child on day 1. Per dad, infant has been latching at breast, this is infant's 4 th feeding, infant breastfed 1st 66 minutes and 2nd and 3rd was 56 to 30 minutes. Mom latched infant on her left breast using the football hold position, infant latched with wide mouth, nose and chin touching breast, swallows heard, "cuh sound", infant was still breastfeeding after 19 minutes when LC left the room. Mom does plan to pump after latching infant or every 3 hours for 15 minutes on initial setting th help establish her milk supply. Mom shown how to use DEBP & how to disassemble, clean, & reassemble parts. Mom will breastfeed infant according to hunger cues, on demand, 8 to 12+ times within 24 hours and not exceed 3 hours without breastfeeding infant.  Mom knows to call RN or LC if she has any questions, concerns or need  assistance with latching infant at breast. Reviewed Baby & Me book's Breastfeeding Basics.  Mom made aware of O/P services, breastfeeding support groups, community resources, and our phone # for post-discharge questions.  Maternal Data Formula Feeding for Exclusion: No Has patient been taught Hand Expression?: Yes Does the patient have breastfeeding experience prior to this delivery?: Yes  Feeding Feeding Type: Breast Fed Nipple Type: Slow - flow  LATCH Score Latch: Grasps breast easily, tongue down, lips flanged, rhythmical sucking.  Audible Swallowing: Spontaneous and intermittent  Type of Nipple: Everted at rest and after stimulation  Comfort (Breast/Nipple): Soft / non-tender  Hold (Positioning): Assistance needed to correctly position infant at breast and maintain latch.  LATCH Score: 9  Interventions Interventions: Breast feeding basics reviewed;Breast compression;Assisted with latch;Adjust position;DEBP;Support pillows;Skin to skin;Breast massage;Position options;Hand express;Expressed milk  Lactation Tools Discussed/Used WIC Program: No Pump Review: Setup, frequency, and cleaning;Milk Storage Initiated by:: [name], IBCLC Date initiated:: 07/05/19   Consult Status Consult Status: Follow-up Date: 07/06/19 Follow-up type: In-patient    [name] 07/05/2019, 11:20 PM

## 2019-07-05 NOTE — Transfer of Care (Signed)
Immediate Anesthesia Transfer of Care Note  Patient: Gina Richard  Procedure(s) Performed: CESAREAN SECTION (N/A Abdomen)  Patient Location: PACU  Anesthesia Type:Spinal  Level of Consciousness: awake, alert  and oriented  Airway & Oxygen Therapy: Patient Spontanous Breathing  Post-op Assessment: Report given to RN and Post -op Vital signs reviewed and stable  Post vital signs: Reviewed and stable  Last Vitals:  Vitals Value Taken Time  BP 124/74 07/05/19 0841  Temp    Pulse 96 07/05/19 0843  Resp 22 07/05/19 0843  SpO2 100 % 07/05/19 0843  Vitals shown include unvalidated device data.  Last Pain:  Vitals:   07/05/19 0551  TempSrc: Oral         Complications: No apparent anesthesia complications

## 2019-07-05 NOTE — Op Note (Signed)
Gina Richard PROCEDURE DATE: 07/05/2019  PREOPERATIVE DIAGNOSIS: Intrauterine pregnancy at  [redacted]w[redacted]d weeks gestation, previous C/S x 1, A2DM  POSTOPERATIVE DIAGNOSIS: The same  PROCEDURE:  Repeat Low Transverse Cesarean Section  SURGEON:  Dr. Linda Hedges  INDICATIONS: Gina Richard is a 29 y.o. G2P1001 at [redacted]w[redacted]d scheduled for cesarean section secondary to desire for repeat.  The risks of cesarean section discussed with the patient included but were not limited to: bleeding which may require transfusion or reoperation; infection which may require antibiotics; injury to bowel, bladder, ureters or other surrounding organs; injury to the fetus; need for additional procedures including hysterectomy in the event of a life-threatening hemorrhage; placental abnormalities wth subsequent pregnancies, incisional problems, thromboembolic phenomenon and other postoperative/anesthesia complications. The patient concurred with the proposed plan, giving informed written consent for the procedure.    FINDINGS:  Viable female infant in cephalic presentation, APGARs 8,9: weight pending Clear amniotic fluid.  Intact placenta, three vessel cord.  Grossly normal uterus, ovaries and fallopian tubes. .   ANESTHESIA:  Spinal ESTIMATED BLOOD LOSS: 135 mL ml SPECIMENS: Placenta sent to L&D COMPLICATIONS: None immediate  PROCEDURE IN DETAIL:  The patient received intravenous antibiotics and had sequential compression devices applied to her lower extremities while in the preoperative area.  She was then taken to the operating room where spinal anesthesia was administered and was found to be adequate. She was then placed in a dorsal supine position with a leftward tilt, and prepped and draped in a sterile manner.  A foley catheter was placed into her bladder and attached to constant gravity.  After an adequate timeout was performed, a Pfannenstiel skin incision was made with scalpel and carried through to the underlying layer  of fascia. The fascia was incised in the midline and this incision was extended bilaterally using the Mayo scissors. Kocher clamps were applied to the superior aspect of the fascial incision and the underlying rectus muscles were dissected off bluntly. A similar process was carried out on the inferior aspect of the facial incision. The rectus muscles were separated in the midline bluntly and the peritoneum was entered bluntly.  Bladder flap was created sharply and developed bluntly.  Bladder blade was placed.  A transverse hysterotomy was made with a scalpel and extended bilaterally bluntly. The bladder blade was then removed. The infant was successfully delivered, and cord was clamped and cut and infant was handed over to awaiting neonatology team. Uterine massage was then administered and the placenta delivered intact with three-vessel cord. The uterus was cleared of clot and debris.  The hysterotomy was closed with 0 chromic.  A second imbricating suture of 0-chromic was used to reinforce the incision and aid in hemostasis.  The peritoneum and rectus muscles were noted to be hemostatic and were reapproximated using 3-0 monocryl in a running fashion.  The fascia was closed with 0-PDS in a running fashion with good restoration of anatomy.  The subcutaneus tissue was copiously irrigated and reapproximated using 3 interrupted plain gut stitches.  The skin was closed with 4-0 vicryl in a subcuticular fashion.  Pt tolerated the procedure will.  All counts were correct x2.  Pt went to the recovery room in stable condition.

## 2019-07-05 NOTE — Anesthesia Procedure Notes (Signed)
Spinal  Patient location during procedure: OR Start time: 07/05/2019 7:21 AM End time: 07/05/2019 7:23 AM Staffing Performed: anesthesiologist  Anesthesiologist: Pervis Hocking, DO Preanesthetic Checklist Completed: patient identified, IV checked, risks and benefits discussed, surgical consent, monitors and equipment checked, pre-op evaluation and timeout performed Spinal Block Patient position: sitting Prep: DuraPrep and site prepped and draped Patient monitoring: cardiac monitor, continuous pulse ox and blood pressure Approach: midline Location: L3-4 Injection technique: single-shot Needle Needle type: Pencan  Needle gauge: 24 G Needle length: 9 cm Assessment Sensory level: T6 Additional Notes Functioning IV was confirmed and monitors were applied. Sterile prep and drape, including hand hygiene and sterile gloves were used. The patient was positioned and the spine was prepped. The skin was anesthetized with lidocaine.  Free flow of clear CSF was obtained prior to injecting local anesthetic into the CSF.  The spinal needle aspirated freely following injection.  The needle was carefully withdrawn.  The patient tolerated the procedure well.

## 2019-07-05 NOTE — Progress Notes (Signed)
No change to H&P.  Kinleigh Nault, DO 

## 2019-07-05 NOTE — Anesthesia Postprocedure Evaluation (Signed)
Anesthesia Post Note  Patient: Gina Richard  Procedure(s) Performed: CESAREAN SECTION (N/A Abdomen)     Patient location during evaluation: PACU Anesthesia Type: Spinal Level of consciousness: oriented and awake and alert Pain management: pain level controlled Vital Signs Assessment: post-procedure vital signs reviewed and stable Respiratory status: spontaneous breathing and respiratory function stable Cardiovascular status: blood pressure returned to baseline and stable Postop Assessment: no headache, no backache, no apparent nausea or vomiting, patient able to bend at knees and spinal receding Anesthetic complications: no    Last Vitals:  Vitals:   07/05/19 0945 07/05/19 0956  BP: 119/67 112/68  Pulse: 79 71  Resp: 20 18  Temp:  36.7 C  SpO2: 99% 99%    Last Pain:  Vitals:   07/05/19 1000  TempSrc:   PainSc: 0-No pain   Pain Goal:                Epidural/Spinal Function Cutaneous sensation: Tingles (07/05/19 1000), Patient able to flex knees: Yes (07/05/19 1000), Patient able to lift hips off bed: Yes (07/05/19 1000), Back pain beyond tenderness at insertion site: No (07/05/19 1000), Progressively worsening motor and/or sensory loss: No (07/05/19 1000), Bowel and/or bladder incontinence post epidural: No (07/05/19 1000)  Pervis Hocking

## 2019-07-06 LAB — CBC
HCT: 30.8 % — ABNORMAL LOW (ref 36.0–46.0)
Hemoglobin: 9.7 g/dL — ABNORMAL LOW (ref 12.0–15.0)
MCH: 28.8 pg (ref 26.0–34.0)
MCHC: 31.5 g/dL (ref 30.0–36.0)
MCV: 91.4 fL (ref 80.0–100.0)
Platelets: 163 10*3/uL (ref 150–400)
RBC: 3.37 MIL/uL — ABNORMAL LOW (ref 3.87–5.11)
RDW: 14.9 % (ref 11.5–15.5)
WBC: 11.7 10*3/uL — ABNORMAL HIGH (ref 4.0–10.5)
nRBC: 0 % (ref 0.0–0.2)

## 2019-07-06 LAB — BIRTH TISSUE RECOVERY COLLECTION (PLACENTA DONATION)

## 2019-07-06 NOTE — Progress Notes (Signed)
Subjective: Postpartum Day 1: Cesarean Delivery Patient reports nausea, tolerating PO, + flatus and no problems voiding.    Objective: Vital signs in last 24 hours: Temp:  [97.8 F (36.6 C)-99 F (37.2 C)] 98.6 F (37 C) (06/04 0750) Pulse Rate:  [71-91] 91 (06/04 0750) Resp:  [16-20] 16 (06/04 0750) BP: (111-134)/(65-91) 126/76 (06/04 0750) SpO2:  [97 %-99 %] 98 % (06/04 0750)  Physical Exam:  General: alert, cooperative, appears stated age and no distress Lochia: appropriate Uterine Fundus: firm Incision: healing well DVT Evaluation: No evidence of DVT seen on physical exam.  Recent Labs    07/06/19 0432  HGB 9.7*  HCT 30.8*    Assessment/Plan: Status post Cesarean section. Doing well postoperatively.  Continue current care.  Luz Lex 07/06/2019, 9:26 AM

## 2019-07-06 NOTE — Lactation Note (Signed)
This note was copied from a baby's chart. Lactation Consultation Note  Patient Name: Gina Richard [id number] Date: 07/06/2019 Reason for consult: Follow-up assessment;Term  Baby is 90 hours old with 4.95% weight loss of a P2. Mother shared challenges previously experienced while breastfeeding her first child. Mother reports some challenges with milk supply at this time. Parents reported supplementing with formula to make sure baby is properly fed. Mother states she will continue pumping and offer that to baby. Parents are knowledgeable and confident about supplementing as well as following the recommended guidelines.  Discussed supplementation guidelines, stomach size and paced bottle feeding to avoid overfeeding issues. Encouraged frequent burping and sit up position. Encouraged to contact lactation services as needed for any questions or concerns.   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 All documentation was completed and filed according to procedure. Pump was delivered to room.   Consult Status Consult Status: Follow-up Date: 07/07/19 Follow-up type: In-patient    Shawnia Vizcarrondo A Higuera Ancidey 07/06/2019, 7:14 PM

## 2019-07-07 MED ORDER — IBUPROFEN 800 MG PO TABS: 800.0000 mg | ORAL_TABLET | Freq: Four times a day (QID) | ORAL | 0 refills | Status: DC

## 2019-07-07 MED FILL — IBUPROFEN 800 MG TABS: 800 | 5 days supply | Qty: 30 | Fill #0

## 2019-07-07 NOTE — Lactation Note (Signed)
This note was copied from a baby's chart. Lactation Consultation Note  Patient Name: Gina Richard [id number] Date: 07/07/2019 Reason for consult: Follow-up assessment   P2, Baby 76 hours old and mother giving bottle of formula upon entering. Mother states she latches well once she give formula first and then breastfeeds. Stools transitioning.  Family declines latch assistance. Encouraged family to discuss volume with Ped MD. Reviewed engorgement care and monitoring voids/stools.    Maternal Data    Feeding    LATCH Score                   Interventions Interventions: DEBP  Lactation Tools Discussed/Used     Consult Status Consult Status: Complete Date: 07/07/19    Vivianne Master [facility] 07/07/2019, 9:38 AM

## 2019-07-07 NOTE — Discharge Summary (Signed)
Postpartum Discharge Summary  Date of Service updated     Patient Name: Gina Richard DOB: March 02, 1990 MRN: [id number]  Date of admission: 07/05/2019 Delivery date:07/05/2019  Delivering provider: Linda Richard  Date of discharge: 07/07/2019  Admitting diagnosis: S/P cesarean section [Z98.891] Previous cesarean section [Z98.891] Intrauterine pregnancy: [redacted]w[redacted]d    Secondary diagnosis:  Active Problems:   S/P cesarean section   Previous cesarean section  Additional problems: A2DM    Discharge diagnosis: Term Pregnancy Delivered and GDM A2                                              Post partum procedures:  Augmentation: na Complications: None  Hospital course: Sceduled C/S   [name] admitted to the hospital 07/05/2019 for scheduled cesarean section with the following indication:Elective Repeat.Delivery details are as follows:  Membrane Rupture Time/Date: 7:52 AM ,07/05/2019   Delivery Method:C-Section, Low Transverse  Details of operation can be found in separate operative note.  Patient had an uncomplicated postpartum course.  She is ambulating, tolerating a regular diet, passing flatus, and urinating well. Patient is discharged home in stable condition on  07/07/19        Newborn Data: Birth date:07/05/2019  Birth time:7:52 AM  Gender:Female  Living status:Living  Apgars:8 ,9  Weight:4040 g     Magnesium Sulfate received: No BMZ received: No Rhophylac:N/A MMR:N/A T-DaP:Given prenatally Flu: N/A Transfusion:No  Physical exam  Vitals:   07/06/19 0750 07/06/19 1504 07/06/19 2258 07/07/19 0532  BP: 126/76 119/77 128/79 123/84  Pulse: 91 92 90 85  Resp: 16 18 20 19   Temp: 98.6 F (37 C) 98.2 F (36.8 C) 97.7 F (36.5 C) 98.1 F (36.7 C)  TempSrc: Oral Oral Oral Oral  SpO2: 98% 100% 99% 100%  Weight:      Height:       General: alert, cooperative and no distress Lochia: appropriate Uterine Fundus: firm Incision: Healing well with no  significant drainage DVT Evaluation: No evidence of DVT seen on physical exam. Labs: Lab Results  Component Value Date   WBC 11.7 (H) 07/06/2019   HGB 9.7 (L) 07/06/2019   HCT 30.8 (L) 07/06/2019   MCV 91.4 07/06/2019   PLT 163 07/06/2019   CMP Latest Ref Rng & Units 03/11/2017  Total Protein 6.5 - 8.1 g/dL 7.2  Total Bilirubin 0.3 - 1.2 mg/dL 0.3  Alkaline Phos 38 - 126 U/L 74  AST 15 - 41 U/L 23  ALT 14 - 54 U/L 29   Edinburgh Score: Edinburgh Postnatal Depression Scale Screening Tool 07/06/2019  I have been able to laugh and see the funny side of things. 0  I have looked forward with enjoyment to things. 0  I have blamed myself unnecessarily when things went wrong. 1  I have been anxious or worried for no good reason. 2  I have felt scared or panicky for no good reason. 2  Things have been getting on top of me. 1  I have been so unhappy that I have had difficulty sleeping. 0  I have felt sad or miserable. 1  I have been so unhappy that I have been crying. 1  The thought of harming myself has occurred to me. 0  Edinburgh Postnatal Depression Scale Total 8      After visit meds:  Allergies as of 07/07/2019      Reactions   Shellfish Allergy Nausea And Vomiting      Medication List    TAKE these medications   acetaminophen 500 MG tablet Commonly known as: TYLENOL Take 500-1,000 mg by mouth every 6 (six) hours as needed (for pain.).   ferrous sulfate 325 (65 FE) MG tablet Take 325 mg by mouth daily with lunch.   glyBURIDE 2.5 MG tablet Commonly known as: DIABETA Take 2.5 mg by mouth at bedtime.   ibuprofen 800 MG tablet Commonly known as: ADVIL Take 1 tablet (800 mg total) by mouth every 6 (six) hours.   prenatal vitamin w/FE, FA 27-1 MG Tabs tablet Take 1 tablet by mouth daily.        Discharge home in stable condition Infant Feeding: Breast Infant Disposition:home with mother Discharge instruction: per After Visit Summary and Postpartum  booklet. Activity: Advance as tolerated. Pelvic rest for 6 weeks.  Diet: carb modified diet Anticipated Birth Control: Unsure Postpartum Appointment:6 weeks Additional Postpartum F/U:    Future Appointments:No future appointments. Follow up Visit:      07/07/2019 Luz Lex, MD

## 2019-08-08 ENCOUNTER — Other Ambulatory Visit: Payer: Self-pay | Admitting: Obstetrics & Gynecology

## 2019-08-08 DIAGNOSIS — K769 Liver disease, unspecified: Secondary | ICD-10-CM

## 2019-10-04 ENCOUNTER — Ambulatory Visit (HOSPITAL_COMMUNITY): Payer: No Typology Code available for payment source

## 2019-10-04 ENCOUNTER — Encounter (HOSPITAL_COMMUNITY): Payer: Self-pay

## 2019-10-22 ENCOUNTER — Ambulatory Visit (HOSPITAL_COMMUNITY)
Admission: RE | Admit: 2019-10-22 | Discharge: 2019-10-22 | Disposition: A | Discharge status: Home / Self Care | Payer: No Typology Code available for payment source | Source: Ambulatory Visit | Attending: Obstetrics & Gynecology | Admitting: Obstetrics & Gynecology

## 2019-10-22 ENCOUNTER — Other Ambulatory Visit: Payer: Self-pay

## 2019-10-22 DIAGNOSIS — K769 Liver disease, unspecified: Secondary | ICD-10-CM | POA: Diagnosis present

## 2019-10-22 MED ORDER — GADOBUTROL 1 MMOL/ML IV SOLN
10.0000 mL | Freq: Once | INTRAVENOUS | Status: AC | PRN
  Administered 2019-10-22: 10 mL via INTRAVENOUS

## 2019-10-24 MED FILL — ALPRAZolam 0.5 MG TABS: 0.5 | 8 days supply | Qty: 30 | Fill #0

## 2019-11-01 ENCOUNTER — Ambulatory Visit (INDEPENDENT_AMBULATORY_CARE_PROVIDER_SITE_OTHER): Payer: No Typology Code available for payment source | Admitting: Gastroenterology

## 2019-11-02 MED FILL — ALPRAZolam 0.5 MG TABS: 0.5 | 8 days supply | Qty: 30 | Fill #0

## 2019-11-13 ENCOUNTER — Other Ambulatory Visit: Payer: Self-pay

## 2019-11-13 ENCOUNTER — Ambulatory Visit (INDEPENDENT_AMBULATORY_CARE_PROVIDER_SITE_OTHER): Payer: No Typology Code available for payment source | Admitting: Internal Medicine

## 2019-11-13 ENCOUNTER — Encounter (INDEPENDENT_AMBULATORY_CARE_PROVIDER_SITE_OTHER): Payer: Self-pay | Admitting: Internal Medicine

## 2019-11-13 DIAGNOSIS — K921 Melena: Secondary | ICD-10-CM

## 2019-11-13 DIAGNOSIS — D134 Benign neoplasm of liver: Secondary | ICD-10-CM | POA: Insufficient documentation

## 2019-11-13 DIAGNOSIS — K760 Fatty (change of) liver, not elsewhere classified: Secondary | ICD-10-CM

## 2019-11-13 NOTE — Patient Instructions (Addendum)
Keep written record as to frequency of bleeding episodes the next 3 months and call office with progress report. Physician will call the results of blood tests when completed. MR liver to be scheduled in March 2022.   Increase intake of fiber rich foods. Must increase physical activity which could be as simple as walking 30 minutes every day.

## 2019-11-13 NOTE — Progress Notes (Signed)
Reason for consultation  Liver lesions  History of present illness  Gina Richard is 29 year old Caucasian female who is referred through courtesy of Dr. Christin Bach Morris for evaluation of liver lesions.  She had abdominal ultrasound in December 2018 for right upper quadrant abdominal pain nausea and vomiting.  Study was negative for cholelithiasis or dilated bile duct and revealed 44 x 40 x 41 mm lobulated hypoechoic lesion involving right lobe of the liver.  It was thought to be hemangioma although lesion appeared to be hypovascular.  Further evaluation was undertaken with MR of liver in January 2019.  Revealing multiple liver lesions dominant of which corresponded to 1 seen on ultrasound.  In addition there were 5 other smaller lesions.  These lesions met criteria for hepatic adenomas and follow-up study was recommended.  In addition she was noted to have fatty liver. She had follow-up MRI of the liver in September 2021 at least 5 lesions.  The dominant lesion in the right hepatic lobe measured 35 x 24 mm and had decreased from prior size of 43 x 32 mm.  Other lesions were smaller measuring 12 mm, 10 mm, 7 mm and 5 mm. Smaller lesion measuring 5 mm revealed arterial phase enhancement.  Once again fatty liver was noted.  Patient has no complaints.  She has good appetite.  She denies heartburn nausea vomiting dysphagia or vomiting.  She has occasional nausea experience with fatty or greasy foods.  Her bowels are irregular.  She is prone to constipation.  She generally has 3-4 bowel movements per week.  She has noted blood on the tissue on straining.  She gives history of hemorrhoids which she developed during the pregnancy.  She has not experienced frank rectal bleeding. She is now working part-time.  She stays busy with her daughters but she does not do any regular physical activity. She does not drink alcohol.  She received hepatitis a and B vaccination while she was in middle school. Her family history  is significant for hepatic adenomas in her paternal aunt who ended up having left lobe of her liver removed 16 years ago and is done fine.  Current Medications: Outpatient Encounter Medications as of 11/13/2019  Medication Sig  . acetaminophen (TYLENOL) 500 MG tablet Take 500-1,000 mg by mouth every 6 (six) hours as needed (for pain.).  Marland Kitchen ibuprofen (ADVIL) 800 MG tablet Take 1 tablet (800 mg total) by mouth every 6 (six) hours.  . ferrous sulfate 325 (65 FE) MG tablet Take 325 mg by mouth daily with lunch. (Patient not taking: Reported on 11/13/2019)  . [DISCONTINUED] glyBURIDE (DIABETA) 2.5 MG tablet Take 2.5 mg by mouth at bedtime. (Patient not taking: Reported on 11/13/2019)  . [DISCONTINUED] prenatal vitamin w/FE, FA (PRENATAL 1 + 1) 27-1 MG TABS tablet Take 1 tablet by mouth daily. (Patient not taking: Reported on 11/13/2019)   No facility-administered encounter medications on file as of 11/13/2019.   Past medical history  History of gestational diabetes mellitus. C-section in June 2021. Fatty liver. Hepatic adenoma. Obesity.   Allergies Allergies  Allergen Reactions  . Shellfish Allergy Nausea And Vomiting     Family history  Both parents and 1 older sister are in good health. Maternal grandfather had lymphoma and died at 1. Maternal grandmother had multiple myeloma and lived to be 33. Paternal grandfather had coronary artery disease and died at age 28. Paternal grandmother has Parkinson's disease and she has surgery for colorectal carcinoma at age 77. Her paternal aunt had left  lobe of liver removed due to necrotic hepatic adenomas in 2005 at age 71.  Social history  She is married.  She has 2 daughters ages 69 years and 66 months old.  They are both in good health.  She is working part-time at: As a [employer] administrator.  She has never smoked cigarettes and does not drink alcohol.   Physical examination  Blood pressure 111/72, pulse 76, temperature 99 F (37.2  C), temperature source Oral, height _0  (1.6 m), weight 251 lb 9.6 oz (114.1 kg), last menstrual period 11/04/2019, not currently breastfeeding.  BMI 44.57 Patient is alert and in no acute distress. She is wearing a mask. Conjunctiva is pink. Sclera is nonicteric Oropharyngeal mucosa is normal. No neck masses or thyromegaly noted. Cardiac exam with regular rhythm normal S1 and S2. No murmur or gallop noted. Lungs are clear to auscultation. Abdomen is full but soft and nontender without organomegaly or masses. No LE edema or clubbing noted.  Labs/studies Results:  CBC Latest Ref Rng & Units 07/06/2019 07/03/2019 10/02/2015  WBC 4.0 - 10.5 K/uL 11.7(H) 9.3 11.3(H)  Hemoglobin 12.0 - 15.0 g/dL 9.7(L) 11.1(L) 9.3(L)  Hematocrit 36 - 46 % 30.8(L) 35.6(L) 29.0(L)  Platelets 150 - 400 K/uL 163 196 187    CMP Latest Ref Rng & Units 03/11/2017  Total Protein 6.5 - 8.1 g/dL 7.2  Total Bilirubin 0.3 - 1.2 mg/dL 0.3  Alkaline Phos 38 - 126 U/L 74  AST 15 - 41 U/L 23  ALT 14 - 54 U/L 29    Hepatic Function Latest Ref Rng & Units 03/11/2017  Total Protein 6.5 - 8.1 g/dL 7.2  Albumin 3.5 - 5.0 g/dL 3.7  AST 15 - 41 U/L 23  ALT 14 - 54 U/L 29  Alk Phosphatase 38 - 126 U/L 74  Total Bilirubin 0.3 - 1.2 mg/dL 0.3  Bilirubin, Direct 0.1 - 0.5 mg/dL 0.1     Assessment:  #1.  Multiple liver lesions.  Dominant lesion which was initially discovered on ultrasound of December 2018 meets criterion for hepatic adenomas as well as other lesions with exception of the smaller 1 which may well be a small hemangioma.  These lesions do not meet criterion for [place].  No history of chronic liver disease other than steatosis. It is interesting to mention that her paternal aunt had hepatic adenomas as well.  #2.  Fatty liver.  Transaminases normal.  Given her young age she is definitely at risk to develop progressive disease in the years ahead.  She must make some changes in her diet and lifestyle.  She needs to be  doing regular physical activity.  #3.  Anemia.  Anemia appears to be due to recent C-section.  Will check H&H.  #4.  Hematochezia appears to be due to hemorrhoids which she has history of.  However if hematochezia persists will consider diagnostic flexible sigmoidoscopy.   Recommendations  Patient will go to the lab for CBC, LFTs and alpha-fetoprotein. She should incorporate regular physical activity into her lifestyle along with changing her diet so that she watches intake of processed foods pack with calories. She will also increase intake of fiber rich foods to help with constipation. She will keep a written record as to frequency of bleeding episodes and call with progress report in 3 months.  If hematochezia persists would consider flexible sigmoidoscopy. She will have MRI of liver without and with contrast in 6 months.

## 2019-12-12 LAB — AFP TUMOR MARKER: AFP-Tumor Marker: 1.4 ng/mL

## 2019-12-12 LAB — HEPATIC FUNCTION PANEL
AG Ratio: 1.5 (calc) (ref 1.0–2.5)
ALT: 19 U/L (ref 6–29)
AST: 17 U/L (ref 10–30)
Albumin: 4.1 g/dL (ref 3.6–5.1)
Alkaline phosphatase (APISO): 64 U/L (ref 31–125)
Bilirubin, Direct: 0.1 mg/dL (ref 0.0–0.2)
Globulin: 2.7 g/dL (calc) (ref 1.9–3.7)
Indirect Bilirubin: 0.3 mg/dL (calc) (ref 0.2–1.2)
Total Bilirubin: 0.4 mg/dL (ref 0.2–1.2)
Total Protein: 6.8 g/dL (ref 6.1–8.1)

## 2019-12-12 LAB — CBC
HCT: 37.1 % (ref 35.0–45.0)
Hemoglobin: 12 g/dL (ref 11.7–15.5)
MCH: 27.8 pg (ref 27.0–33.0)
MCHC: 32.3 g/dL (ref 32.0–36.0)
MCV: 85.9 fL (ref 80.0–100.0)
MPV: 10.7 fL (ref 7.5–12.5)
Platelets: 261 10*3/uL (ref 140–400)
RBC: 4.32 10*6/uL (ref 3.80–5.10)
RDW: 13.3 % (ref 11.0–15.0)
WBC: 7.2 10*3/uL (ref 3.8–10.8)

## 2020-01-24 ENCOUNTER — Telehealth: Payer: No Typology Code available for payment source | Admitting: Physician Assistant

## 2020-01-24 ENCOUNTER — Other Ambulatory Visit: Payer: Self-pay | Admitting: Physician Assistant

## 2020-01-24 DIAGNOSIS — B9789 Other viral agents as the cause of diseases classified elsewhere: Secondary | ICD-10-CM

## 2020-01-24 DIAGNOSIS — J019 Acute sinusitis, unspecified: Secondary | ICD-10-CM

## 2020-01-24 MED ORDER — NAPROXEN 500 MG PO TABS
500.0000 mg | ORAL_TABLET | Freq: Two times a day (BID) | ORAL | 0 refills | Status: DC
Start: 2020-01-24 — End: 2020-01-24

## 2020-01-24 MED ORDER — FLUTICASONE PROPIONATE 50 MCG/ACT NA SUSP
2.0000 | Freq: Every day | NASAL | 6 refills | Status: DC
Start: 2020-01-24 — End: 2020-01-24

## 2020-01-24 MED FILL — NAPROXEN 500 MG TABLET: 500 | 15 days supply | Qty: 30 | Fill #0

## 2020-01-24 MED FILL — FLUTICASONE PROP 50 MCG SPR: 50 | 30 days supply | Qty: 16 | Fill #0

## 2020-01-24 NOTE — Progress Notes (Signed)
We are sorry that you are not feeling well.  Here is how we plan to help!  Based on what you have shared with me it looks like you have sinusitis.  Sinusitis is inflammation and infection in the sinus cavities of the head.  Based on your presentation I believe you most likely have Acute Viral Sinusitis.This is an infection most likely caused by a virus.   There is not specific treatment for viral sinusitis other than to help you with the symptoms until the infection runs its course.  You may use an oral decongestant such as Mucinex D or if you have glaucoma or high blood pressure use plain Mucinex. Saline nasal spray help and can safely be used as often as needed for congestion, I have prescribed: Fluticasone nasal spray two sprays in each nostril once a day and Naprosyn 500mg  twice daily for headaches.   Some authorities believe that zinc sprays or the use of Echinacea may shorten the course of your symptoms.  Sinus infections are not as easily transmitted as other respiratory infection, however we still recommend that you avoid close contact with loved ones, especially the very young and elderly.  Remember to wash your hands thoroughly throughout the day as this is the number one way to prevent the spread of infection!  Home Care:  Only take medications as instructed by your medical team.  Do not take these medications with alcohol.  A steam or ultrasonic humidifier can help congestion.  You can place a towel over your head and breathe in the steam from hot water coming from a faucet.  Avoid close contacts especially the very young and the elderly.  Cover your mouth when you cough or sneeze.  Always remember to wash your hands.  Get Help Right Away If:  You develop worsening fever or sinus pain.  You develop a severe head ache or visual changes.  Your symptoms persist after you have completed your treatment plan.  Make sure you  Understand these instructions.  Will watch your  condition.  Will get help right away if you are not doing well or get worse.  Your e-visit answers were reviewed by a board certified advanced clinical practitioner to complete your personal care plan.  Depending on the condition, your plan could have included both over the counter or prescription medications.  If there is a problem please reply  once you have received a response from your provider.  Your safety is important to [place].  If you have drug allergies check your prescription carefully.    You can use MyChart to ask questions about today's visit, request a non-urgent call back, or ask for a work or school excuse for 24 hours related to this e-Visit. If it has been greater than 24 hours you will need to follow up with your provider, or enter a new e-Visit to address those concerns.  You will get an e-mail in the next two days asking about your experience.  I hope that your e-visit has been valuable and will speed your recovery. Thank you for using e-visits.  Greater than 5 minutes, yet less than 10 minutes of time have been spent researching, coordinating and implementing care for this patient today.

## 2020-03-27 ENCOUNTER — Encounter (INDEPENDENT_AMBULATORY_CARE_PROVIDER_SITE_OTHER): Payer: Self-pay | Admitting: *Deleted

## 2020-09-16 ENCOUNTER — Other Ambulatory Visit: Payer: Self-pay | Admitting: Obstetrics & Gynecology

## 2020-09-16 DIAGNOSIS — N6311 Unspecified lump in the right breast, upper outer quadrant: Secondary | ICD-10-CM

## 2020-09-18 ENCOUNTER — Ambulatory Visit
Admission: RE | Admit: 2020-09-18 | Discharge: 2020-09-18 | Disposition: A | Discharge status: Home / Self Care | Payer: No Typology Code available for payment source | Source: Ambulatory Visit | Attending: Obstetrics & Gynecology | Admitting: Obstetrics & Gynecology

## 2020-09-18 ENCOUNTER — Other Ambulatory Visit: Payer: Self-pay

## 2020-09-18 DIAGNOSIS — N6311 Unspecified lump in the right breast, upper outer quadrant: Secondary | ICD-10-CM

## 2020-12-01 ENCOUNTER — Other Ambulatory Visit (HOSPITAL_COMMUNITY): Payer: Self-pay

## 2020-12-01 MED ORDER — MOXIFLOXACIN HCL 0.5 % OP SOLN
1.0000 [drp] | OPHTHALMIC | 1 refills | Status: DC
  Filled 2020-12-01: qty 3, 4d supply, fill #0

## 2020-12-12 ENCOUNTER — Telehealth: Payer: No Typology Code available for payment source | Admitting: Physician Assistant

## 2020-12-12 DIAGNOSIS — J069 Acute upper respiratory infection, unspecified: Secondary | ICD-10-CM

## 2020-12-12 DIAGNOSIS — H66009 Acute suppurative otitis media without spontaneous rupture of ear drum, unspecified ear: Secondary | ICD-10-CM | POA: Diagnosis not present

## 2020-12-12 MED ORDER — FLUTICASONE PROPIONATE 50 MCG/ACT NA SUSP: 2.0000 | Freq: Every day | NASAL | 0 refills | Status: DC

## 2020-12-12 MED ORDER — NEOMYCIN-POLYMYXIN-HC 3.5-10000-1 OT SOLN: OTIC | 0 refills | Status: DC

## 2020-12-12 MED ORDER — IPRATROPIUM BROMIDE 0.03 % NA SOLN: 2.0000 | Freq: Two times a day (BID) | NASAL | 0 refills | Status: DC

## 2020-12-12 MED ORDER — BENZONATATE 100 MG PO CAPS: 100.0000 mg | ORAL_CAPSULE | Freq: Three times a day (TID) | ORAL | 0 refills | Status: DC | PRN

## 2020-12-12 NOTE — Progress Notes (Signed)
E Visit for Ear Infection  We are sorry that you are not feeling well. Here is how we plan to help!  I have prescribed: Neomycin 0.35%, polymyxin B 10,000 units/mL, and hydrocortisone 0,5% otic solution 4 drops in affected ears four times a day for 7 days  If you have a fever 102 and up and significantly worsening symptoms, this could indicate a more serious infection moving to the middle/inner and needs face to face evaluation in an office by a provider.  Your symptoms should improve over the next 3 days and should resolve in about 7 days.  HOME CARE:  Wash your hands frequently. Do not place the tip of the bottle on your ear or touch it with your fingers. You can take Acetominophen 650 mg every 4-6 hours as needed for pain.  If pain is severe or moderate, you can apply a heating pad (set on low) or hot water bottle (wrapped in a towel) to outer ear for 20 minutes.  This will also increase drainage. Avoid ear plugs Do not use Q-tips After showers, help the water run out by tilting your head to one side.  GET HELP RIGHT AWAY IF:  Fever is over 102.2 degrees. You develop progressive ear pain or hearing loss. Ear symptoms persist longer than 3 days after treatment.  MAKE SURE YOU:  Understand these instructions. Will watch your condition. Will get help right away if you are not doing well or get worse.  TO PREVENT SWIMMER'S EAR: Use a bathing cap or custom fitted swim molds to keep your ears dry. Towel off after swimming to dry your ears. Tilt your head or pull your earlobes to allow the water to escape your ear canal. If there is still water in your ears, consider using a hairdryer on the lowest setting.   Thank you for choosing an e-visit.  Your e-visit answers were reviewed by a board certified advanced clinical practitioner to complete your personal care plan. Depending upon the condition, your plan could have included both over the counter or prescription  medications.  Please review your pharmacy choice. Make sure the pharmacy is open so you can pick up prescription now. If there is a problem, you may contact your provider through [employer] and have the prescription routed to another pharmacy.  Your safety is important to [place]. If you have drug allergies check your prescription carefully.   For the next 24 hours you can use MyChart to ask questions about today's visit, request a non-urgent call back, or ask for a work or school excuse. You will get an email in the next two days asking about your experience. I hope that your e-visit has been valuable and will speed your recovery.

## 2020-12-12 NOTE — Progress Notes (Signed)
E-Visit for Upper Respiratory Infection   We are sorry you are not feeling well.  Here is how we plan to help!  Based on what you have shared with me, it looks like you may have a viral upper respiratory infection.  Upper respiratory infections are caused by a large number of viruses; however, rhinovirus is the most common cause.   Symptoms vary from person to person, with common symptoms including sore throat, cough, fatigue or lack of energy and feeling of general discomfort.  A low-grade fever of up to 100.4 may present, but is often uncommon.  Symptoms vary however, and are closely related to a person's age or underlying illnesses.  The most common symptoms associated with an upper respiratory infection are nasal discharge or congestion, cough, sneezing, headache and pressure in the ears and face.  These symptoms usually persist for about 3 to 10 days, but can last up to 2 weeks.  It is important to know that upper respiratory infections do not cause serious illness or complications in most cases.    Upper respiratory infections can be transmitted from person to person, with the most common method of transmission being a person's hands.  The virus is able to live on the skin and can infect other persons for up to 2 hours after direct contact.  Also, these can be transmitted when someone coughs or sneezes; thus, it is important to cover the mouth to reduce this risk.  To keep the spread of the illness at [place], good hand hygiene is very important.  This is an infection that is most likely caused by a virus. There are no specific treatments other than to help you with the symptoms until the infection runs its course.  We are sorry you are not feeling well.  Here is how we plan to help!   For nasal congestion, you may use an oral decongestants such as Mucinex D or if you have glaucoma or high blood pressure use plain Mucinex.  Saline nasal spray or nasal drops can help and can safely be used as often as  needed for congestion.  For your congestion, I have prescribed Ipratropium Bromide nasal spray 0.03% two sprays in each nostril 2-3 times a dayand flonase 2 sprays each nostril once daily  If you do not have a history of heart disease, hypertension, diabetes or thyroid disease, prostate/bladder issues or glaucoma, you may also use Sudafed to treat nasal congestion.  It is highly recommended that you consult with a pharmacist or your primary care physician to ensure this medication is safe for you to take.     If you have a cough, you may use cough suppressants such as Delsym and Robitussin.  If you have glaucoma or high blood pressure, you can also use Coricidin HBP.   For cough I have prescribed for you A prescription cough medication called Tessalon Perles 100 mg. You may take 1-2 capsules every 8 hours as needed for cough  If you have a sore or scratchy throat, use a saltwater gargle-  to  teaspoon of salt dissolved in a 4-ounce to 8-ounce glass of warm water.  Gargle the solution for approximately 15-30 seconds and then spit.  It is important not to swallow the solution.  You can also use throat lozenges/cough drops and Chloraseptic spray to help with throat pain or discomfort.  Warm or cold liquids can also be helpful in relieving throat pain.  For headache, pain or general discomfort, you can use  Ibuprofen or Tylenol as directed.   Some authorities believe that zinc sprays or the use of Echinacea may shorten the course of your symptoms.   HOME CARE Only take medications as instructed by your medical team. Be sure to drink plenty of fluids. Water is fine as well as fruit juices, sodas and electrolyte beverages. You may want to stay away from caffeine or alcohol. If you are nauseated, try taking small sips of liquids. How do you know if you are getting enough fluid? Your urine should be a pale yellow or almost colorless. Get rest. Taking a steamy shower or using a humidifier may help nasal  congestion and ease sore throat pain. You can place a towel over your head and breathe in the steam from hot water coming from a faucet. Using a saline nasal spray works much the same way. Cough drops, hard candies and sore throat lozenges may ease your cough. Avoid close contacts especially the very young and the elderly Cover your mouth if you cough or sneeze Always remember to wash your hands.   GET HELP RIGHT AWAY IF: You develop worsening fever. If your symptoms do not improve within 10 days You develop yellow or green discharge from your nose over 3 days. You have coughing fits You develop a severe head ache or visual changes. You develop shortness of breath, difficulty breathing or start having chest pain Your symptoms persist after you have completed your treatment plan  MAKE SURE YOU  Understand these instructions. Will watch your condition. Will get help right away if you are not doing well or get worse.  Thank you for choosing an e-visit.  Your e-visit answers were reviewed by a board certified advanced clinical practitioner to complete your personal care plan. Depending upon the condition, your plan could have included both over the counter or prescription medications.  Please review your pharmacy choice. Make sure the pharmacy is open so you can pick up prescription now. If there is a problem, you may contact your provider through [employer] and have the prescription routed to another pharmacy.  Your safety is important to [place]. If you have drug allergies check your prescription carefully.   For the next 24 hours you can use MyChart to ask questions about today's visit, request a non-urgent call back, or ask for a work or school excuse. You will get an email in the next two days asking about your experience. I hope that your e-visit has been valuable and will speed your recovery.    I provided 5 minutes of non face-to-face time during this encounter for chart review  and documentation.

## 2020-12-12 NOTE — Addendum Note (Signed)
Addended by: Mar Daring on: 12/12/2020 09:46 AM   Modules accepted: Orders

## 2020-12-29 ENCOUNTER — Ambulatory Visit (INDEPENDENT_AMBULATORY_CARE_PROVIDER_SITE_OTHER): Payer: No Typology Code available for payment source | Admitting: Gastroenterology

## 2021-01-30 ENCOUNTER — Telehealth: Payer: No Typology Code available for payment source | Admitting: Physician Assistant

## 2021-01-30 DIAGNOSIS — B9689 Other specified bacterial agents as the cause of diseases classified elsewhere: Secondary | ICD-10-CM | POA: Diagnosis not present

## 2021-01-30 DIAGNOSIS — J019 Acute sinusitis, unspecified: Secondary | ICD-10-CM

## 2021-01-30 MED ORDER — AMOXICILLIN-POT CLAVULANATE 875-125 MG PO TABS: 1.0000 | ORAL_TABLET | Freq: Two times a day (BID) | ORAL | 0 refills | Status: DC

## 2021-01-30 NOTE — Progress Notes (Signed)

## 2021-01-30 NOTE — Progress Notes (Signed)
I have spent 5 minutes in review of e-visit questionnaire, review and updating patient chart, medical decision making and response to patient.   Jonica Bickhart Cody Meshia Rau, PA-C    

## 2021-02-06 ENCOUNTER — Telehealth: Payer: No Typology Code available for payment source | Admitting: Emergency Medicine

## 2021-02-06 DIAGNOSIS — B9789 Other viral agents as the cause of diseases classified elsewhere: Secondary | ICD-10-CM

## 2021-02-06 MED ORDER — FLUTICASONE PROPIONATE 50 MCG/ACT NA SUSP: 2.0000 | Freq: Every day | NASAL | 0 refills | Status: DC

## 2021-02-06 NOTE — Progress Notes (Signed)
E-Visit for Sinus Problems  I wouldn't add on a steroid.  I would add an antihistamine such as flonase, Zyrtec, or Claritin.  If you're already taking one, then continue.  This may need to run it's course.  It could be  another week or two before it clears up.  Based on what you have shared with me it looks like you have sinusitis.  Sinusitis is inflammation and infection in the sinus cavities of the head.  Based on your presentation I believe you most likely have Acute Viral Sinusitis.This is an infection most likely caused by a virus. There is not specific treatment for viral sinusitis other than to help you with the symptoms until the infection runs its course.  You may use an oral decongestant such as Mucinex D or if you have glaucoma or high blood pressure use plain Mucinex. Saline nasal spray help and can safely be used as often as needed for congestion, I have prescribed: Fluticasone nasal spray two sprays in each nostril once a day  Some authorities believe that zinc sprays or the use of Echinacea may shorten the course of your symptoms.  Sinus infections are not as easily transmitted as other respiratory infection, however we still recommend that you avoid close contact with loved ones, especially the very young and elderly.  Remember to wash your hands thoroughly throughout the day as this is the number one way to prevent the spread of infection!  Home Care: Only take medications as instructed by your medical team. Do not take these medications with alcohol. A steam or ultrasonic humidifier can help congestion.  You can place a towel over your head and breathe in the steam from hot water coming from a faucet. Avoid close contacts especially the very young and the elderly. Cover your mouth when you cough or sneeze. Always remember to wash your hands.  Get Help Right Away If: You develop worsening fever or sinus pain. You develop a severe head ache or visual changes. Your symptoms  persist after you have completed your treatment plan.  Make sure you Understand these instructions. Will watch your condition. Will get help right away if you are not doing well or get worse.   Thank you for choosing an e-visit.  Your e-visit answers were reviewed by a board certified advanced clinical practitioner to complete your personal care plan. Depending upon the condition, your plan could have included both over the counter or prescription medications.  Please review your pharmacy choice. Make sure the pharmacy is open so you can pick up prescription now. If there is a problem, you may contact your provider through [employer] and have the prescription routed to another pharmacy.  Your safety is important to [place]. If you have drug allergies check your prescription carefully.   For the next 24 hours you can use MyChart to ask questions about today's visit, request a non-urgent call back, or ask for a work or school excuse. You will get an email in the next two days asking about your experience. I hope that your e-visit has been valuable and will speed your recovery.  Approximately 5 minutes was used in reviewing the patient's chart, questionnaire, prescribing medications, and documentation.

## 2021-02-09 ENCOUNTER — Telehealth: Payer: No Typology Code available for payment source | Admitting: Physician Assistant

## 2021-02-09 DIAGNOSIS — R052 Subacute cough: Secondary | ICD-10-CM

## 2021-02-09 NOTE — Progress Notes (Signed)
Based on what you shared with me, I feel your condition warrants further evaluation and I recommend that you be seen in a face to face visit.  Giving ongoing/recurring symptoms over the past month or so with multiple e-visits/video visits and treatment, you need to be evaluated in person to get a detailed examination and to make sure nothing is getting missed. This way you can get proper treatment.    NOTE: There will be NO CHARGE for this eVisit   If you are having a true medical emergency please call 911.      For an urgent face to face visit, [facility] has six urgent care centers for your convenience:     [facility] [place] [facility] [place] Get Driving Directions [phone] [place], [place] [address]    [facility]) Get Driving Directions [phone] [place], [place] [address]  [facility] [place] [facility]) Get Driving Directions [phone] [address] [place],  [place]  [address]  [facility] Get Driving Directions [phone] [address] [place], [place], [place] [address]   [facility] Get Driving Directions  [phone] [address], [place] [address]   [facility] [place] Get Driving Directions [phone] [address], [place], [place] [address]  Your MyChart E-visit questionnaire answers were reviewed by a board certified advanced clinical practitioner to complete your personal care plan based on your specific symptoms.  Thank you for using e-Visits.

## 2021-02-10 ENCOUNTER — Other Ambulatory Visit: Payer: Self-pay

## 2021-02-10 ENCOUNTER — Ambulatory Visit
Admission: EM | Admit: 2021-02-10 | Discharge: 2021-02-10 | Disposition: A | Discharge status: Home / Self Care | Payer: No Typology Code available for payment source | Attending: Family Medicine | Admitting: Family Medicine

## 2021-02-10 DIAGNOSIS — J309 Allergic rhinitis, unspecified: Secondary | ICD-10-CM

## 2021-02-10 DIAGNOSIS — J3089 Other allergic rhinitis: Secondary | ICD-10-CM

## 2021-02-10 DIAGNOSIS — H6982 Other specified disorders of Eustachian tube, left ear: Secondary | ICD-10-CM | POA: Diagnosis not present

## 2021-02-10 MED ORDER — MONTELUKAST SODIUM 10 MG PO TABS: 10.0000 mg | ORAL_TABLET | Freq: Every day | ORAL | 5 refills | Status: DC

## 2021-02-10 MED ORDER — PREDNISONE 20 MG PO TABS: 40.0000 mg | ORAL_TABLET | Freq: Every day | ORAL | 0 refills | Status: DC

## 2021-02-10 NOTE — ED Provider Notes (Signed)
[facility]    CSN: [id number] Arrival date & time: 02/10/21  0801      History   Chief Complaint Chief Complaint  Patient presents with   Cough    HPI Gina Richard is a 31 y.o. female.   Presenting today with 1 to 7-month history of ongoing sinus pressure, postnasal drainage, nasal congestion, left ear pain and pressure, hacking cough.  Denies known fever, chills, body aches, chest pain, shortness of breath, abdominal pain, nausea vomiting or diarrhea.  Has so far had 4 virtual visits for the symptoms, given Astelin nasal spray, round of Augmentin and has been taking Mucinex, Zyrtec and Flonase throughout duration.  Known history of seasonal allergies, otherwise no known pertinent chronic medical problems.   Past Medical History:  Diagnosis Date   Gestational diabetes    Headache    Hepatic lesion    on a MRI, never went for followup   Hx of varicella     Patient Active Problem List   Diagnosis Date Noted   Hepatic adenoma 11/13/2019   Fatty liver 11/13/2019   Hematochezia 11/13/2019   Previous cesarean section 07/05/2019   Abnormal glucose tolerance test (GTT) during pregnancy, antepartum 04/27/2019   S/P cesarean section 10/01/2015    Past Surgical History:  Procedure Laterality Date   CESAREAN SECTION N/A 10/01/2015   Procedure: CESAREAN SECTION;  Surgeon: Linda Hedges, DO;  Location: [place];  Service: Obstetrics;  Laterality: N/A;  Primary edc 10/02/15 NKDA   CESAREAN SECTION N/A 07/05/2019   Procedure: CESAREAN SECTION;  Surgeon: Linda Hedges, DO;  Location: [facility];  Service: Obstetrics;  Laterality: N/A;  Repeat edc 07/12/19 Heather,  RNFA   KNEE SURGERY      OB History     Gravida  2   Para  2   Term  2   Preterm      AB      Living  2      SAB      IAB      Ectopic      Multiple  0   Live Births  2            Home Medications    Prior to Admission medications   Medication Sig Start Date End Date  Taking? Authorizing Provider  montelukast (SINGULAIR) 10 MG tablet Take 1 tablet (10 mg total) by mouth at bedtime. 02/10/21  Yes Volney American, PA-C  predniSONE (DELTASONE) 20 MG tablet Take 2 tablets (40 mg total) by mouth daily with breakfast. 02/10/21  Yes Volney American, PA-C  acetaminophen (TYLENOL) 500 MG tablet Take 500-1,000 mg by mouth every 6 (six) hours as needed (for pain.).    [provider]  amoxicillin-clavulanate (AUGMENTIN) 875-125 MG tablet Take 1 tablet by mouth 2 (two) times daily. 01/30/21   Brunetta Jeans, PA-C  benzonatate (TESSALON) 100 MG capsule Take 1 capsule (100 mg total) by mouth 3 (three) times daily as needed. 12/12/20   Mar Daring, PA-C  fluticasone (FLONASE) 50 MCG/ACT nasal spray Place 2 sprays into both nostrils daily. 02/06/21   Montine Circle, PA-C  ipratropium (ATROVENT) 0.03 % nasal spray Place 2 sprays into both nostrils every 12 (twelve) hours. 12/12/20   Mar Daring, PA-C  moxifloxacin (VIGAMOX) 0.5 % ophthalmic solution Place 1 drop into the left eye every hour while awake 12/01/20       Family History Family History  Problem Relation Age of Onset  Hypertension Mother    Cancer Maternal Grandmother    Thyroid disease Maternal Grandmother    Cancer Maternal Grandfather    Parkinson's disease Paternal Grandmother    Cancer Paternal Grandmother    Diabetes Paternal Grandmother    Heart disease Paternal Grandfather    Healthy Father    Healthy Sister     Social History Social History   Tobacco Use   Smoking status: Never   Smokeless tobacco: Never  Vaping Use   Vaping Use: Never used  Substance Use Topics   Alcohol use: No   Drug use: No     Allergies   Shellfish allergy   Review of Systems Review of Systems Per HPI  Physical Exam Triage Vital Signs ED Triage Vitals  Enc Vitals Group     BP 02/10/21 0822 135/86     Pulse Rate 02/10/21 0822 (!) 109     Resp 02/10/21 0822 14      Temp 02/10/21 0822 98.8 F (37.1 C)     Temp Source 02/10/21 0822 Oral     SpO2 02/10/21 0822 98 %     Weight --      Height --      Head Circumference --      Peak Flow --      Pain Score 02/10/21 0831 0     Pain Loc --      Pain Edu? --      Excl. in [place]? --    No data found.  Updated Vital Signs BP 135/86 (BP Location: Right Arm)    Pulse (!) 109    Temp 98.8 F (37.1 C) (Oral)    Resp 14    LMP 01/18/2021 (Approximate)    SpO2 98%    Breastfeeding No   Visual Acuity Right Eye Distance:   Left Eye Distance:   Bilateral Distance:    Right Eye Near:   Left Eye Near:    Bilateral Near:     Physical Exam Vitals and nursing note reviewed.  Constitutional:      Appearance: Normal appearance.  HENT:     Head: Atraumatic.     Right Ear: External ear normal.     Left Ear: External ear normal.     Ears:     Comments: Bilateral middle ear effusion    Nose: Rhinorrhea present.     Mouth/Throat:     Mouth: Mucous membranes are moist.     Pharynx: Posterior oropharyngeal erythema present.  Eyes:     Extraocular Movements: Extraocular movements intact.     Conjunctiva/sclera: Conjunctivae normal.  Cardiovascular:     Rate and Rhythm: Normal rate and regular rhythm.     Heart sounds: Normal heart sounds.  Pulmonary:     Effort: Pulmonary effort is normal.     Breath sounds: Normal breath sounds. No wheezing or rales.  Musculoskeletal:        General: Normal range of motion.     Cervical back: Normal range of motion and neck supple.  Skin:    General: Skin is warm and dry.  Neurological:     Mental Status: She is alert and oriented to person, place, and time.  Psychiatric:        Mood and Affect: Mood normal.        Thought Content: Thought content normal.     UC Treatments / Results  Labs (all labs ordered are listed, but only abnormal results are displayed) Labs Reviewed - No data  to display  EKG   Radiology No results found.  Procedures Procedures  (including critical care time)  Medications Ordered in UC Medications - No data to display  Initial Impression / Assessment and Plan / UC Course  I have reviewed the triage vital signs and the nursing notes.  Pertinent labs & imaging results that were available during my care of the patient were reviewed by me and considered in my medical decision making (see chart for details).     Suspect allergic sinusitis with eustachian tube dysfunction all secondary to uncontrolled seasonal allergies.  We will add Singulair, increase antihistamine to twice daily, continue nasal sprays.  Also had a burst of prednisone given duration and severity of symptoms.  Discussed supportive care and return precautions.  Final Clinical Impressions(s) / UC Diagnoses   Final diagnoses:  Seasonal allergic rhinitis due to other allergic trigger  Allergic sinusitis  Eustachian tube dysfunction, left   Discharge Instructions   None    ED Prescriptions     Medication Sig Dispense Auth. Provider   predniSONE (DELTASONE) 20 MG tablet Take 2 tablets (40 mg total) by mouth daily with breakfast. 10 tablet Volney American, PA-C   montelukast (SINGULAIR) 10 MG tablet Take 1 tablet (10 mg total) by mouth at bedtime. 30 tablet Volney American, [place]      PDMP not reviewed this encounter.   Merrie Roof [place], [place] 02/10/21 [phone]

## 2021-02-10 NOTE — ED Triage Notes (Signed)
Patient states she has had a cough for about 4 weeks.   Patient states she had a sinus infection that turned into left ear infection.   Patient states she had a Evisit with her PCP and they gave her Augmentin x7 days and Flonase, Zyrtec and Mucinex.   Patient states that her ear is still stopped up and her cough is no better

## 2021-03-19 LAB — OB RESULTS CONSOLE RPR: RPR: NONREACTIVE

## 2021-03-19 LAB — OB RESULTS CONSOLE ANTIBODY SCREEN: Antibody Screen: NEGATIVE

## 2021-03-19 LAB — OB RESULTS CONSOLE RUBELLA ANTIBODY, IGM: Rubella: IMMUNE

## 2021-03-19 LAB — OB RESULTS CONSOLE HIV ANTIBODY (ROUTINE TESTING): HIV: NONREACTIVE

## 2021-03-19 LAB — HEPATITIS C ANTIBODY: HCV Ab: NEGATIVE

## 2021-03-19 LAB — OB RESULTS CONSOLE ABO/RH: RH Type: POSITIVE

## 2021-03-19 LAB — OB RESULTS CONSOLE HEPATITIS B SURFACE ANTIGEN: Hepatitis B Surface Ag: NEGATIVE

## 2021-04-01 LAB — OB RESULTS CONSOLE GC/CHLAMYDIA
Chlamydia: NEGATIVE
Neisseria Gonorrhea: NEGATIVE

## 2021-09-10 ENCOUNTER — Telehealth: Payer: No Typology Code available for payment source | Admitting: Physician Assistant

## 2021-09-10 DIAGNOSIS — B9689 Other specified bacterial agents as the cause of diseases classified elsewhere: Secondary | ICD-10-CM

## 2021-09-10 DIAGNOSIS — J208 Acute bronchitis due to other specified organisms: Secondary | ICD-10-CM | POA: Diagnosis not present

## 2021-09-10 MED ORDER — AMOXICILLIN 500 MG PO TABS: 500.0000 mg | ORAL_TABLET | Freq: Two times a day (BID) | ORAL | 0 refills | Status: DC

## 2021-09-10 NOTE — Progress Notes (Signed)
We are sorry that you are not feeling well.  Here is how we plan to help!  Based on your presentation I believe you most likely have A cough due to bacteria.  When patients have  a productive cough with a change in color or increased sputum production, we are concerned about bacterial bronchitis.  If left untreated it can progress to pneumonia.  If your symptoms do not improve with your treatment plan it is important that you contact your provider.   Giving pregnancy status, I have prescribed Amoxicillin 500 mg twice daily for 7 days.    In addition you may use A non-prescription cough medication called Mucinex DM: take 2 tablets every 12 hours. You can use OTC antihistamine like Claritin as well, if needed. Do not take any other OTC medications without speaking to the pharmacist or your OBGYN.    From your responses in the eVisit questionnaire you describe inflammation in the upper respiratory tract which is causing a significant cough.  This is commonly called Bronchitis and has four common causes:   Allergies Viral Infections Acid Reflux Bacterial Infection Allergies, viruses and acid reflux are treated by controlling symptoms or eliminating the cause. An example might be a cough caused by taking certain blood pressure medications. You stop the cough by changing the medication. Another example might be a cough caused by acid reflux. Controlling the reflux helps control the cough.  USE OF BRONCHODILATOR ("RESCUE") INHALERS: There is a risk from using your bronchodilator too frequently.  The risk is that over-reliance on a medication which only relaxes the muscles surrounding the breathing tubes can reduce the effectiveness of medications prescribed to reduce swelling and congestion of the tubes themselves.  Although you feel brief relief from the bronchodilator inhaler, your asthma may actually be worsening with the tubes becoming more swollen and filled with mucus.  This can delay other crucial  treatments, such as oral steroid medications. If you need to use a bronchodilator inhaler daily, several times per day, you should discuss this with your provider.  There are probably better treatments that could be used to keep your asthma under control.     HOME CARE Only take medications as instructed by your medical team. Complete the entire course of an antibiotic. Drink plenty of fluids and get plenty of rest. Avoid close contacts especially the very young and the elderly Cover your mouth if you cough or cough into your sleeve. Always remember to wash your hands A steam or ultrasonic humidifier can help congestion.   GET HELP RIGHT AWAY IF: You develop worsening fever. You become short of breath You cough up blood. Your symptoms persist after you have completed your treatment plan MAKE SURE YOU  Understand these instructions. Will watch your condition. Will get help right away if you are not doing well or get worse.    Thank you for choosing an e-visit.  Your e-visit answers were reviewed by a board certified advanced clinical practitioner to complete your personal care plan. Depending upon the condition, your plan could have included both over the counter or prescription medications.  Please review your pharmacy choice. Make sure the pharmacy is open so you can pick up prescription now. If there is a problem, you may contact your provider through [employer] and have the prescription routed to another pharmacy.  Your safety is important to [place]. If you have drug allergies check your prescription carefully.   For the next 24 hours you can use MyChart  to ask questions about today's visit, request a non-urgent call back, or ask for a work or school excuse. You will get an email in the next two days asking about your experience. I hope that your e-visit has been valuable and will speed your recovery.

## 2021-09-10 NOTE — Progress Notes (Signed)
I have spent 5 minutes in review of e-visit questionnaire, review and updating patient chart, medical decision making and response to patient.   Euclid Cassetta Cody Alezander Dimaano, PA-C    

## 2021-09-10 NOTE — Addendum Note (Signed)
Addended by: Brunetta Jeans on: 09/10/2021 12:07 PM   Modules accepted: Orders

## 2021-10-06 ENCOUNTER — Encounter (HOSPITAL_COMMUNITY): Payer: Self-pay | Admitting: *Deleted

## 2021-10-06 ENCOUNTER — Encounter (HOSPITAL_COMMUNITY): Payer: Self-pay

## 2021-10-06 NOTE — Patient Instructions (Addendum)
EVELIN CAKE  10/06/2021   Your procedure is scheduled on:  10/19/2021  Arrive at 1100 at Entrance C on [address] at [facility]  and [address]. You are invited to use the FREE valet parking or use the Visitor's parking deck.  Pick up the phone at the desk and dial [phone].  Call this number if you have problems the morning of surgery: [phone]  Remember:   Do not eat food:(6 Hours before arrival) 6 horas ante llegada.  Do not drink clear liquids: (4 Hours before arrival) 4 horas ante llegada.  Take these medicines the morning of surgery with A SIP OF WATER:  none   Do not wear jewelry, make-up or nail polish.  Do not wear lotions, powders, or perfumes. Do not wear deodorant.  Do not shave 48 hours prior to surgery.  Do not bring valuables to the hospital.  [facility] is not   responsible for any belongings or valuables brought to the hospital.  Contacts, dentures or bridgework may not be worn into surgery.  Leave suitcase in the car. After surgery it may be brought to your room.  For patients admitted to the hospital, checkout time is 11:00 AM the day of              discharge.      Please read over the following fact sheets that you were given:     Preparing for Surgery

## 2021-10-16 ENCOUNTER — Encounter (HOSPITAL_COMMUNITY)
Admission: RE | Admit: 2021-10-16 | Discharge: 2021-10-16 | Disposition: A | Discharge status: Home / Self Care | Payer: No Typology Code available for payment source | Source: Ambulatory Visit | Attending: Obstetrics & Gynecology | Admitting: Obstetrics & Gynecology

## 2021-10-16 DIAGNOSIS — Z98891 History of uterine scar from previous surgery: Secondary | ICD-10-CM

## 2021-10-16 DIAGNOSIS — Z01812 Encounter for preprocedural laboratory examination: Secondary | ICD-10-CM | POA: Insufficient documentation

## 2021-10-16 LAB — CBC
HCT: 35.7 % — ABNORMAL LOW (ref 36.0–46.0)
Hemoglobin: 11.4 g/dL — ABNORMAL LOW (ref 12.0–15.0)
MCH: 28.5 pg (ref 26.0–34.0)
MCHC: 31.9 g/dL (ref 30.0–36.0)
MCV: 89.3 fL (ref 80.0–100.0)
Platelets: 202 10*3/uL (ref 150–400)
RBC: 4 MIL/uL (ref 3.87–5.11)
RDW: 14.9 % (ref 11.5–15.5)
WBC: 8.9 10*3/uL (ref 4.0–10.5)
nRBC: 0 % (ref 0.0–0.2)

## 2021-10-16 LAB — TYPE AND SCREEN
ABO/RH(D): A POS
Antibody Screen: NEGATIVE

## 2021-10-16 LAB — RPR: RPR Ser Ql: NONREACTIVE

## 2021-10-16 NOTE — H&P (Signed)
Gina Richard is a 31 y.o. female G3P2002 at 87 weeks presenting for repeat C/S; previous x 2.  Patient has h/o hepatic adenomas and steatosis; inadequate follow up.  Last imaging in 2021 by MRI and stability was noted at that time.  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  GBS negative.   OB History     Gravida  3   Para  2   Term  2   Preterm      AB      Living  2      SAB      IAB      Ectopic      Multiple  0   Live Births  2          Past Medical History:  Diagnosis Date   Gestational diabetes    Headache    Hepatic lesion    on a MRI, never went for followup   Hx of varicella    Past Surgical History:  Procedure Laterality Date   CESAREAN SECTION N/A 10/01/2015   Procedure: CESAREAN SECTION;  Surgeon: Linda Hedges, DO;  Location: [place];  Service: Obstetrics;  Laterality: N/A;  Primary edc 10/02/15 NKDA   CESAREAN SECTION N/A 07/05/2019   Procedure: CESAREAN SECTION;  Surgeon: Linda Hedges, DO;  Location: [facility];  Service: Obstetrics;  Laterality: N/A;  Repeat edc 07/12/19 Heather,  RNFA   KNEE SURGERY     Family History: family history includes Cancer in her maternal grandfather, maternal grandmother, and paternal grandmother; Diabetes in her paternal grandmother; Healthy in her father and sister; Heart disease in her paternal grandfather; Hypertension in her mother; Parkinson's disease in her paternal grandmother; Thyroid disease in her maternal grandmother. Social History:  reports that she has never smoked. She has never used smokeless tobacco. She reports that she does not drink alcohol and does not use drugs.     Maternal Diabetes: No Genetic Screening: Normal Maternal Ultrasounds/Referrals: Normal Fetal Ultrasounds or other Referrals:  None Maternal Substance Abuse:  No Significant Maternal Medications:  None Significant Maternal Lab Results:  Group B Strep positive Number of Prenatal Visits:greater than 3 verified  prenatal visits Other Comments:  None  Review of Systems Maternal Medical History:  Prenatal complications: no prenatal complications Prenatal Complications - Diabetes: none.     Last menstrual period 01/18/2021. Maternal Exam:  Uterine Assessment: Contraction strength is mild.  Abdomen: Patient reports no abdominal tenderness. Surgical scars: low transverse.   Fundal height is c/w dates.   Estimated fetal weight is 8#8.     Physical Exam Constitutional:      Appearance: Normal appearance.  HENT:     Head: Normocephalic and atraumatic.  Pulmonary:     Effort: Pulmonary effort is normal.  Abdominal:     Palpations: Abdomen is soft.  Musculoskeletal:        General: Normal range of motion.     Cervical back: Normal range of motion.  Skin:    General: Skin is warm and dry.  Neurological:     Mental Status: She is alert and oriented to person, place, and time.  Psychiatric:        Mood and Affect: Mood normal.        Behavior: Behavior normal.     Prenatal labs: ABO, Rh: A/Positive/-- (02/16 0000) Antibody: Negative (02/16 0000) Rubella: Immune (02/16 0000) RPR: Nonreactive (02/16 0000)  HBsAg: Negative (02/16 0000)  HIV: Non-reactive (02/16 0000)  GBS:   Positive  Assessment/Plan: 31 yo G3P2002 at 39 weeks for repeat cesarean section Patient is counseled re: risk of bleeding, infection, scarring and damage to surrounding structures.  She is informed of implications in future pregnancies including uterine rupture and abnormal placentation.  All questions were answered and patient wishes to proceed.   Linda Hedges 10/16/2021, 4:40 AM

## 2021-10-19 ENCOUNTER — Inpatient Hospital Stay (HOSPITAL_COMMUNITY): Payer: No Typology Code available for payment source | Admitting: Anesthesiology

## 2021-10-19 ENCOUNTER — Inpatient Hospital Stay (HOSPITAL_COMMUNITY)
Admission: AD | Admit: 2021-10-19 | Discharge: 2021-10-21 | DRG: 788 | Disposition: A | Discharge status: Home / Self Care | Payer: No Typology Code available for payment source | Attending: Obstetrics & Gynecology | Admitting: Obstetrics & Gynecology

## 2021-10-19 ENCOUNTER — Encounter (HOSPITAL_COMMUNITY): Payer: Self-pay | Admitting: Obstetrics & Gynecology

## 2021-10-19 ENCOUNTER — Other Ambulatory Visit: Payer: Self-pay

## 2021-10-19 ENCOUNTER — Encounter (HOSPITAL_COMMUNITY)
Admission: AD | Disposition: A | Discharge status: Home / Self Care | Payer: Self-pay | Source: Home / Self Care | Attending: Obstetrics & Gynecology

## 2021-10-19 DIAGNOSIS — O99893 Other specified diseases and conditions complicating puerperium: Secondary | ICD-10-CM | POA: Diagnosis present

## 2021-10-19 DIAGNOSIS — D134 Benign neoplasm of liver: Secondary | ICD-10-CM | POA: Diagnosis present

## 2021-10-19 DIAGNOSIS — O34211 Maternal care for low transverse scar from previous cesarean delivery: Secondary | ICD-10-CM | POA: Diagnosis present

## 2021-10-19 DIAGNOSIS — Z3A39 39 weeks gestation of pregnancy: Secondary | ICD-10-CM

## 2021-10-19 DIAGNOSIS — O99214 Obesity complicating childbirth: Secondary | ICD-10-CM | POA: Diagnosis present

## 2021-10-19 DIAGNOSIS — Z8632 Personal history of gestational diabetes: Secondary | ICD-10-CM

## 2021-10-19 DIAGNOSIS — D649 Anemia, unspecified: Secondary | ICD-10-CM | POA: Diagnosis not present

## 2021-10-19 DIAGNOSIS — O24425 Gestational diabetes mellitus in childbirth, controlled by oral hypoglycemic drugs: Secondary | ICD-10-CM | POA: Diagnosis not present

## 2021-10-19 DIAGNOSIS — Z98891 History of uterine scar from previous surgery: Principal | ICD-10-CM

## 2021-10-19 DIAGNOSIS — O99824 Streptococcus B carrier state complicating childbirth: Secondary | ICD-10-CM | POA: Diagnosis present

## 2021-10-19 DIAGNOSIS — O9902 Anemia complicating childbirth: Secondary | ICD-10-CM | POA: Diagnosis not present

## 2021-10-19 SURGERY — Surgical Case
Anesthesia: Spinal

## 2021-10-19 MED ORDER — LACTATED RINGERS IV SOLN: INTRAVENOUS | Status: DC

## 2021-10-19 MED ORDER — SODIUM CHLORIDE 0.9 % IR SOLN
Status: DC | PRN
  Administered 2021-10-19: 225 mL
  Administered 2021-10-19: 1
  Administered 2021-10-19: 250 mL

## 2021-10-19 MED ORDER — STERILE WATER FOR IRRIGATION IR SOLN
Status: DC | PRN
  Administered 2021-10-19: 1

## 2021-10-19 MED ORDER — IBUPROFEN 600 MG PO TABS
600.0000 mg | ORAL_TABLET | Freq: Four times a day (QID) | ORAL | Status: DC
  Administered 2021-10-19 – 2021-10-21 (×7): 600 mg via ORAL
  Filled 2021-10-19 (×7): qty 1

## 2021-10-19 MED ORDER — ONDANSETRON HCL 4 MG/2ML IJ SOLN
INTRAMUSCULAR | Status: DC | PRN
  Administered 2021-10-19: 4 mg via INTRAVENOUS

## 2021-10-19 MED ORDER — DEXAMETHASONE SODIUM PHOSPHATE 4 MG/ML IJ SOLN
INTRAMUSCULAR | Status: DC | PRN
  Administered 2021-10-19: 4 mg via INTRAVENOUS

## 2021-10-19 MED ORDER — PROMETHAZINE HCL 25 MG/ML IJ SOLN: 6.2500 mg | INTRAMUSCULAR | Status: DC | PRN

## 2021-10-19 MED ORDER — SIMETHICONE 80 MG PO CHEW
80.0000 mg | CHEWABLE_TABLET | Freq: Three times a day (TID) | ORAL | Status: DC
  Administered 2021-10-20 (×3): 80 mg via ORAL
  Filled 2021-10-19 (×3): qty 1

## 2021-10-19 MED ORDER — FENTANYL CITRATE (PF) 100 MCG/2ML IJ SOLN: 25.0000 ug | INTRAMUSCULAR | Status: DC | PRN

## 2021-10-19 MED ORDER — SCOPOLAMINE 1 MG/3DAYS TD PT72: 1.0000 | MEDICATED_PATCH | Freq: Once | TRANSDERMAL | Status: DC

## 2021-10-19 MED ORDER — DEXTROSE 5 % IV SOLN
INTRAVENOUS | Status: DC | PRN
  Administered 2021-10-19: 3 g via INTRAVENOUS

## 2021-10-19 MED ORDER — OXYTOCIN-SODIUM CHLORIDE 30-0.9 UT/500ML-% IV SOLN
INTRAVENOUS | Status: DC | PRN
  Administered 2021-10-19: 300 mL via INTRAVENOUS

## 2021-10-19 MED ORDER — WITCH HAZEL-GLYCERIN EX PADS: 1.0000 | MEDICATED_PAD | CUTANEOUS | Status: DC | PRN

## 2021-10-19 MED ORDER — MEPERIDINE HCL 25 MG/ML IJ SOLN: 6.2500 mg | INTRAMUSCULAR | Status: DC | PRN

## 2021-10-19 MED ORDER — POVIDONE-IODINE 10 % EX SWAB
2.0000 | Freq: Once | CUTANEOUS | Status: AC
  Administered 2021-10-19: 2 via TOPICAL

## 2021-10-19 MED ORDER — MENTHOL 3 MG MT LOZG: 1.0000 | LOZENGE | OROMUCOSAL | Status: DC | PRN

## 2021-10-19 MED ORDER — DIBUCAINE (PERIANAL) 1 % EX OINT: 1.0000 | TOPICAL_OINTMENT | CUTANEOUS | Status: DC | PRN

## 2021-10-19 MED ORDER — OXYTOCIN-SODIUM CHLORIDE 30-0.9 UT/500ML-% IV SOLN: 2.5000 [IU]/h | INTRAVENOUS | Status: AC

## 2021-10-19 MED ORDER — PRENATAL MULTIVITAMIN CH
1.0000 | ORAL_TABLET | Freq: Every day | ORAL | Status: DC
  Administered 2021-10-20: 1 via ORAL
  Filled 2021-10-19: qty 1

## 2021-10-19 MED ORDER — SENNOSIDES-DOCUSATE SODIUM 8.6-50 MG PO TABS
2.0000 | ORAL_TABLET | Freq: Every day | ORAL | Status: DC
  Administered 2021-10-20: 2 via ORAL
  Filled 2021-10-19: qty 2

## 2021-10-19 MED ORDER — PHENYLEPHRINE HCL (PRESSORS) 10 MG/ML IV SOLN
INTRAVENOUS | Status: DC | PRN
  Administered 2021-10-19: 80 ug via INTRAVENOUS

## 2021-10-19 MED ORDER — FENTANYL CITRATE (PF) 100 MCG/2ML IJ SOLN
INTRAMUSCULAR | Status: DC | PRN
  Administered 2021-10-19: 15 ug via INTRATHECAL

## 2021-10-19 MED ORDER — OXYTOCIN-SODIUM CHLORIDE 30-0.9 UT/500ML-% IV SOLN
INTRAVENOUS | Status: AC
  Filled 2021-10-19: qty 500

## 2021-10-19 MED ORDER — CEFAZOLIN IN SODIUM CHLORIDE 3-0.9 GM/100ML-% IV SOLN
INTRAVENOUS | Status: AC
  Filled 2021-10-19: qty 100

## 2021-10-19 MED ORDER — FENTANYL CITRATE (PF) 100 MCG/2ML IJ SOLN
INTRAMUSCULAR | Status: AC
  Filled 2021-10-19: qty 2

## 2021-10-19 MED ORDER — ACETAMINOPHEN 500 MG PO TABS
1000.0000 mg | ORAL_TABLET | Freq: Once | ORAL | Status: AC
  Administered 2021-10-19: 1000 mg via ORAL

## 2021-10-19 MED ORDER — OXYCODONE HCL 5 MG PO TABS: 5.0000 mg | ORAL_TABLET | ORAL | Status: DC | PRN

## 2021-10-19 MED ORDER — ZOLPIDEM TARTRATE 5 MG PO TABS: 5.0000 mg | ORAL_TABLET | Freq: Every evening | ORAL | Status: DC | PRN

## 2021-10-19 MED ORDER — CEFAZOLIN SODIUM-DEXTROSE 2-4 GM/100ML-% IV SOLN: 2.0000 g | INTRAVENOUS | Status: DC

## 2021-10-19 MED ORDER — SODIUM CHLORIDE 0.9% FLUSH
3.0000 mL | INTRAVENOUS | Status: DC | PRN
Start: 2021-10-19 — End: 2021-10-21

## 2021-10-19 MED ORDER — DEXAMETHASONE SODIUM PHOSPHATE 4 MG/ML IJ SOLN
INTRAMUSCULAR | Status: AC
  Filled 2021-10-19: qty 1

## 2021-10-19 MED ORDER — OXYTOCIN-SODIUM CHLORIDE 30-0.9 UT/500ML-% IV SOLN: INTRAVENOUS | Status: DC | PRN

## 2021-10-19 MED ORDER — DIPHENHYDRAMINE HCL 25 MG PO CAPS: 25.0000 mg | ORAL_CAPSULE | ORAL | Status: DC | PRN

## 2021-10-19 MED ORDER — COCONUT OIL OIL: 1.0000 | TOPICAL_OIL | Status: DC | PRN

## 2021-10-19 MED ORDER — PHENYLEPHRINE 80 MCG/ML (10ML) SYRINGE FOR IV PUSH (FOR BLOOD PRESSURE SUPPORT)
PREFILLED_SYRINGE | INTRAVENOUS | Status: AC
  Filled 2021-10-19: qty 10

## 2021-10-19 MED ORDER — AMISULPRIDE (ANTIEMETIC) 5 MG/2ML IV SOLN: 10.0000 mg | Freq: Once | INTRAVENOUS | Status: DC | PRN

## 2021-10-19 MED ORDER — ACETAMINOPHEN 500 MG PO TABS
ORAL_TABLET | ORAL | Status: AC
  Filled 2021-10-19: qty 2

## 2021-10-19 MED ORDER — SIMETHICONE 80 MG PO CHEW: 80.0000 mg | CHEWABLE_TABLET | ORAL | Status: DC | PRN

## 2021-10-19 MED ORDER — SCOPOLAMINE 1 MG/3DAYS TD PT72
MEDICATED_PATCH | TRANSDERMAL | Status: AC
  Filled 2021-10-19: qty 1

## 2021-10-19 MED ORDER — MORPHINE SULFATE (PF) 0.5 MG/ML IJ SOLN
INTRAMUSCULAR | Status: DC | PRN
  Administered 2021-10-19: 150 ug via INTRATHECAL

## 2021-10-19 MED ORDER — BUPIVACAINE IN DEXTROSE 0.75-8.25 % IT SOLN
INTRATHECAL | Status: DC | PRN
  Administered 2021-10-19: 1.6 mL via INTRATHECAL

## 2021-10-19 MED ORDER — ACETAMINOPHEN 500 MG PO TABS
1000.0000 mg | ORAL_TABLET | Freq: Four times a day (QID) | ORAL | Status: DC
  Administered 2021-10-19 – 2021-10-21 (×7): 1000 mg via ORAL
  Filled 2021-10-19 (×7): qty 2

## 2021-10-19 MED ORDER — ONDANSETRON HCL 4 MG/2ML IJ SOLN: 4.0000 mg | Freq: Three times a day (TID) | INTRAMUSCULAR | Status: DC | PRN

## 2021-10-19 MED ORDER — DIPHENHYDRAMINE HCL 25 MG PO CAPS: 25.0000 mg | ORAL_CAPSULE | Freq: Four times a day (QID) | ORAL | Status: DC | PRN

## 2021-10-19 MED ORDER — NALOXONE HCL 4 MG/10ML IJ SOLN: 1.0000 ug/kg/h | INTRAVENOUS | Status: DC | PRN

## 2021-10-19 MED ORDER — ONDANSETRON HCL 4 MG/2ML IJ SOLN
INTRAMUSCULAR | Status: AC
  Filled 2021-10-19: qty 2

## 2021-10-19 MED ORDER — NALOXONE HCL 0.4 MG/ML IJ SOLN: 0.4000 mg | INTRAMUSCULAR | Status: DC | PRN

## 2021-10-19 MED ORDER — DIPHENHYDRAMINE HCL 50 MG/ML IJ SOLN
INTRAMUSCULAR | Status: AC
  Filled 2021-10-19: qty 1

## 2021-10-19 MED ORDER — MORPHINE SULFATE (PF) 0.5 MG/ML IJ SOLN
INTRAMUSCULAR | Status: AC
  Filled 2021-10-19: qty 10

## 2021-10-19 MED ORDER — SCOPOLAMINE 1 MG/3DAYS TD PT72
1.0000 | MEDICATED_PATCH | TRANSDERMAL | Status: DC
  Administered 2021-10-19: 1.5 mg via TRANSDERMAL

## 2021-10-19 MED ORDER — DIPHENHYDRAMINE HCL 50 MG/ML IJ SOLN
12.5000 mg | INTRAMUSCULAR | Status: DC | PRN
  Administered 2021-10-19 – 2021-10-20 (×2): 12.5 mg via INTRAVENOUS
  Filled 2021-10-19: qty 1

## 2021-10-19 MED ORDER — PHENYLEPHRINE HCL-NACL 20-0.9 MG/250ML-% IV SOLN
INTRAVENOUS | Status: DC | PRN
  Administered 2021-10-19: 60 ug/min via INTRAVENOUS

## 2021-10-19 MED ORDER — TETANUS-DIPHTH-ACELL PERTUSSIS 5-2.5-18.5 LF-MCG/0.5 IM SUSY: 0.5000 mL | PREFILLED_SYRINGE | Freq: Once | INTRAMUSCULAR | Status: DC

## 2021-10-19 SURGICAL SUPPLY — 34 items
ADH SKN CLS APL DERMABOND .7 (GAUZE/BANDAGES/DRESSINGS)
APL SKNCLS STERI-STRIP NONHPOA (GAUZE/BANDAGES/DRESSINGS) ×1
BENZOIN TINCTURE PRP APPL 2/3 (GAUZE/BANDAGES/DRESSINGS) ×1 IMPLANT
CHLORAPREP W/TINT 26ML (MISCELLANEOUS) ×2 IMPLANT
CLAMP CORD UMBIL (MISCELLANEOUS) ×1 IMPLANT
CLOTH BEACON ORANGE TIMEOUT ST (SAFETY) ×1 IMPLANT
DERMABOND ADVANCED .7 DNX12 (GAUZE/BANDAGES/DRESSINGS) IMPLANT
DRSG OPSITE POSTOP 4X10 (GAUZE/BANDAGES/DRESSINGS) ×1 IMPLANT
ELECT REM PT RETURN 9FT ADLT (ELECTROSURGICAL) ×1
ELECTRODE REM PT RTRN 9FT ADLT (ELECTROSURGICAL) ×1 IMPLANT
EXTRACTOR VACUUM KIWI (MISCELLANEOUS) IMPLANT
GAUZE SPONGE 4X4 12PLY STRL LF (GAUZE/BANDAGES/DRESSINGS) IMPLANT
GLOVE BIO SURGEON STRL SZ 6 (GLOVE) ×1 IMPLANT
GLOVE BIOGEL PI IND STRL 6 (GLOVE) ×2 IMPLANT
GLOVE BIOGEL PI IND STRL 7.0 (GLOVE) ×1 IMPLANT
GOWN STRL REUS W/TWL LRG LVL3 (GOWN DISPOSABLE) ×2 IMPLANT
KIT ABG SYR 3ML LUER SLIP (SYRINGE) ×1 IMPLANT
NDL HYPO 25X5/8 SAFETYGLIDE (NEEDLE) ×1 IMPLANT
NEEDLE HYPO 25X5/8 SAFETYGLIDE (NEEDLE) ×1 IMPLANT
NS IRRIG 1000ML POUR BTL (IV SOLUTION) ×1 IMPLANT
PACK C SECTION WH (CUSTOM PROCEDURE TRAY) ×1 IMPLANT
PAD ABD 7.5X8 STRL (GAUZE/BANDAGES/DRESSINGS) IMPLANT
PAD OB MATERNITY 4.3X12.25 (PERSONAL CARE ITEMS) ×1 IMPLANT
STRIP CLOSURE SKIN 1/2X4 (GAUZE/BANDAGES/DRESSINGS) IMPLANT
SUT CHROMIC 0 CTX 36 (SUTURE) ×3 IMPLANT
SUT MON AB 2-0 CT1 27 (SUTURE) ×1 IMPLANT
SUT PDS AB 0 CT1 27 (SUTURE) IMPLANT
SUT PLAIN 0 NONE (SUTURE) IMPLANT
SUT PLAIN 2 0 XLH (SUTURE) IMPLANT
SUT VIC AB 0 CT1 36 (SUTURE) IMPLANT
SUT VIC AB 4-0 KS 27 (SUTURE) IMPLANT
TOWEL OR 17X24 6PK STRL BLUE (TOWEL DISPOSABLE) ×1 IMPLANT
TRAY FOLEY W/BAG SLVR 14FR LF (SET/KITS/TRAYS/PACK) IMPLANT
WATER STERILE IRR 1000ML POUR (IV SOLUTION) ×1 IMPLANT

## 2021-10-19 NOTE — Anesthesia Procedure Notes (Signed)
Spinal  Patient location during procedure: OR Start time: 10/19/2021 2:07 PM End time: 10/19/2021 2:17 PM Reason for block: surgical anesthesia Staffing Performed: anesthesiologist  Anesthesiologist: Duane Boston, MD Performed by: Duane Boston, MD Authorized by: Duane Boston, MD   Preanesthetic Checklist Completed: patient identified, IV checked, risks and benefits discussed, surgical consent, monitors and equipment checked, pre-op evaluation and timeout performed Spinal Block Patient position: sitting Prep: DuraPrep Patient monitoring: cardiac monitor, continuous pulse ox and blood pressure Approach: midline Location: L2-3 Injection technique: single-shot Needle Needle type: Pencan  Needle gauge: 24 G Needle length: 9 cm Assessment Events: CSF return Additional Notes Functioning IV was confirmed and monitors were applied. Sterile prep and drape, including hand hygiene and sterile gloves were used. The patient was positioned and the spine was prepped. The skin was anesthetized with lidocaine.  Free flow of clear CSF was obtained prior to injecting local anesthetic into the CSF.  The spinal needle aspirated freely following injection.  The needle was carefully withdrawn.  The patient tolerated the procedure well.

## 2021-10-19 NOTE — Anesthesia Postprocedure Evaluation (Deleted)
Anesthesia Post Note  Patient: Gina Richard  Procedure(s) Performed: REPEAT CESAREAN SECTION EDC: 10-25-21 ALLEG: SHELLFISH DERIVED  PREVIOUS X 2     Patient location during evaluation: PACU Anesthesia Type: Spinal Level of consciousness: awake and alert Pain management: pain level controlled Vital Signs Assessment: post-procedure vital signs reviewed and stable Respiratory status: spontaneous breathing and respiratory function stable Cardiovascular status: blood pressure returned to baseline and stable Postop Assessment: spinal receding Anesthetic complications: no   No notable events documented.  Last Vitals:  Vitals:   10/19/21 1615 10/19/21 1616  BP: 125/66   Pulse: 80 91  Resp: (!) 22   Temp:    SpO2: 98% 97%    Last Pain:  Vitals:   10/19/21 1115  TempSrc: Oral   Pain Goal:                   Elizardo Chilson DANIEL

## 2021-10-19 NOTE — Anesthesia Procedure Notes (Signed)
Spinal  Patient location during procedure: OR Start time: 10/19/2021 2:08 PM End time: 10/19/2021 2:16 PM Reason for block: surgical anesthesia Staffing Performed: anesthesiologist  Anesthesiologist: Duane Boston, MD Performed by: Duane Boston, MD Authorized by: Duane Boston, MD   Preanesthetic Checklist Completed: patient identified, IV checked, risks and benefits discussed, surgical consent, monitors and equipment checked, pre-op evaluation and timeout performed Spinal Block Patient position: sitting Prep: DuraPrep Patient monitoring: cardiac monitor, continuous pulse ox and blood pressure Approach: midline Location: L2-3 Injection technique: single-shot Needle Needle type: Pencan  Needle gauge: 24 G Needle length: 9 cm Assessment Events: CSF return Additional Notes Functioning IV was confirmed and monitors were applied. Sterile prep and drape, including hand hygiene and sterile gloves were used. The patient was positioned and the spine was prepped. The skin was anesthetized with lidocaine.  Free flow of clear CSF was obtained prior to injecting local anesthetic into the CSF.  The spinal needle aspirated freely following injection.  The needle was carefully withdrawn.  The patient tolerated the procedure well.

## 2021-10-19 NOTE — Addendum Note (Signed)
Addendum  created 10/19/21 1632 by Duane Boston, MD   Delete clinical note

## 2021-10-19 NOTE — Transfer of Care (Signed)
Immediate Anesthesia Transfer of Care Note  Patient: Gina Richard  Procedure(s) Performed: REPEAT CESAREAN SECTION EDC: 10-25-21 ALLEG: SHELLFISH DERIVED  PREVIOUS X 2  Patient Location: PACU  Anesthesia Type:Spinal  Level of Consciousness: awake, alert  and oriented  Airway & Oxygen Therapy: Patient Spontanous Breathing  Post-op Assessment: Report given to RN and Post -op Vital signs reviewed and stable  Post vital signs: Reviewed and stable  Last Vitals:  Vitals Value Taken Time  BP 125/66 10/19/21 1615  Temp    Pulse 91 10/19/21 1616  Resp    SpO2 97 % 10/19/21 1616    Last Pain:  Vitals:   10/19/21 1115  TempSrc: Oral         Complications: No notable events documented.

## 2021-10-19 NOTE — Op Note (Signed)
[name] PROCEDURE DATE: 10/19/2021  PREOPERATIVE DIAGNOSIS: Intrauterine pregnancy at  22w1dweeks gestation, previous cesarean delivery x 2  POSTOPERATIVE DIAGNOSIS: The same  PROCEDURE:  Repeat Low Transverse Cesarean Section  SURGEON:  Dr. MLinda Hedges INDICATIONS: KARELLA BLINDERis a 31y.o. [id number] 348w1dcheduled for cesarean section secondary to previous C/S x 2.  The risks of cesarean section discussed with the patient included but were not limited to: bleeding which may require transfusion or reoperation; infection which may require antibiotics; injury to bowel, bladder, ureters or other surrounding organs; injury to the fetus; need for additional procedures including hysterectomy in the event of a life-threatening hemorrhage; placental abnormalities wth subsequent pregnancies, incisional problems, thromboembolic phenomenon and other postoperative/anesthesia complications. The patient concurred with the proposed plan, giving informed written consent for the procedure.    FINDINGS:  Viable female infant in cephalic presentation, APGARs 8,9: weight pending  Clear amniotic fluid.  Intact placenta, three vessel cord.  Grossly normal ovaries and fallopian tubes.  Adhesions of the bladder to mid-uterine corpus.  Dense adhesions of fascia to anterior abdominal wall.  Extremely thin lower uterine segment.    **Recommend no more pregnancies given dense adhesive disease and extremely thin lower uterine segment.  Patient extensively counseled re: this recommendation in the OR.   . Marland Kitchen ANESTHESIA: Spinal ESTIMATED BLOOD LOSS: 237 ml SPECIMENS: Placenta sent to L&D COMPLICATIONS: None immediate  PROCEDURE IN DETAIL:  The patient received intravenous antibiotics and had sequential compression devices applied to her lower extremities while in the preoperative area.  She was then taken to the operating room where spinal anesthesia was administered and was found to be adequate. She was then  placed in a dorsal supine position with a leftward tilt, and prepped and draped in a sterile manner.  A foley catheter was placed into her bladder and attached to constant gravity.  After an adequate timeout was performed, a Pfannenstiel skin incision was made with scalpel and carried through to the underlying layer of fascia. The fascial layer was extremely dense and thickened.  The fascia was incised in the midline and this incision was extended bilaterally using the Mayo scissors. Kocher clamps were applied to the superior aspect of the fascial incision and the underlying rectus muscles were dissected off bluntly. Tissue planes were very difficult to distinguish given scarring.  A similar process was carried out on the inferior aspect of the facial incision. The rectus muscles were separated in the midline bluntly and the peritoneum was entered bluntly. Bladder adhesions were taken down from the mid-uterine corpus. Bladder flap was created sharply and developed bluntly.  A transverse hysterotomy was made with a scalpel and extended bilaterally bluntly. The bladder blade was then removed. The infant was successfully delivered using a single Kiwi vacuum pull, and cord was clamped and cut and infant was handed over to awaiting neonatology team. Uterine massage was then administered and the placenta delivered intact with three-vessel cord. The uterus was cleared of clot and debris.  The hysterotomy was closed with 0 chromic.  A second imbricating suture of 0-chromic was used to reinforce the incision and aid in hemostasis.  The lower uterine segment was so thin the suture tore through in 2 places.  These areas were oversewn with 0 chromic figure of eight stitches to reapproximate the tissues.  The peritoneum and rectus muscles were noted to be hemostatic and were reapproximated using 3-0 monocryl in a running fashion.  The fascia was  closed with 0-PDS in a running fashion with good restoration of anatomy.  The  subcutaneus tissue was copiously irrigated.  3 interrupted plain gut stitches were placed in the subcutaneous tissue to close dead space.  The skin was closed with 4-0 vicryl in a subcuticular fashion.  Pt tolerated the procedure will.  All counts were correct x2.  Pt went to the recovery room in stable condition.

## 2021-10-19 NOTE — Progress Notes (Signed)
No change to H&P.  Dontarius Sheley, DO 

## 2021-10-19 NOTE — Anesthesia Preprocedure Evaluation (Addendum)
Anesthesia Evaluation  Patient identified by MRN, date of birth, ID band Patient awake    Reviewed: Allergy & Precautions, NPO status , Patient's Chart, lab work & pertinent test results  History of Anesthesia Complications Negative for: history of anesthetic complications  Airway Mallampati: III  TM Distance: >3 FB Neck ROM: Full    Dental no notable dental hx. (+) Dental Advisory Given   Pulmonary neg pulmonary ROS,    Pulmonary exam normal        Cardiovascular negative cardio ROS Normal cardiovascular exam     Neuro/Psych  Headaches, negative psych ROS   GI/Hepatic negative GI ROS, Neg liver ROS,   Endo/Other  diabetes, Well Controlled, Gestational, Oral Hypoglycemic AgentsMorbid obesityBMI 74  Renal/GU negative Renal ROS  negative genitourinary   Musculoskeletal negative musculoskeletal ROS (+)   Abdominal   Peds negative pediatric ROS (+)  Hematology  (+) Blood dyscrasia, anemia , hct 35.6, plt 196   Anesthesia Other Findings   Reproductive/Obstetrics (+) Pregnancy Two prior c sections                            Anesthesia Physical  Anesthesia Plan  ASA: 3  Anesthesia Plan: Spinal   Post-op Pain Management:    Induction:   PONV Risk Score and Plan: 3 and Ondansetron, Dexamethasone and Treatment may vary due to age or medical condition  Airway Management Planned: Natural Airway and Nasal Cannula  Additional Equipment: None  Intra-op Plan:   Post-operative Plan:   Informed Consent: I have reviewed the patients History and Physical, chart, labs and discussed the procedure including the risks, benefits and alternatives for the proposed anesthesia with the patient or authorized representative who has indicated his/her understanding and acceptance.     Dental advisory given  Plan Discussed with: Anesthesiologist and CRNA  Anesthesia Plan Comments:         Anesthesia Quick Evaluation

## 2021-10-20 LAB — CBC
HCT: 28.8 % — ABNORMAL LOW (ref 36.0–46.0)
Hemoglobin: 9.5 g/dL — ABNORMAL LOW (ref 12.0–15.0)
MCH: 29.2 pg (ref 26.0–34.0)
MCHC: 33 g/dL (ref 30.0–36.0)
MCV: 88.6 fL (ref 80.0–100.0)
Platelets: 161 10*3/uL (ref 150–400)
RBC: 3.25 MIL/uL — ABNORMAL LOW (ref 3.87–5.11)
RDW: 14.9 % (ref 11.5–15.5)
WBC: 12.1 10*3/uL — ABNORMAL HIGH (ref 4.0–10.5)
nRBC: 0 % (ref 0.0–0.2)

## 2021-10-20 NOTE — Progress Notes (Signed)
Subjective: Postpartum Day 1: Cesarean Delivery Patient reports tolerating PO, + flatus, and no problems voiding.    Objective: Vital signs in last 24 hours: Temp:  [98.2 F (36.8 C)-99 F (37.2 C)] 98.4 F (36.9 C) (09/19 0810) Pulse Rate:  [64-91] 64 (09/19 0810) Resp:  [16-22] 18 (09/19 0810) BP: (115-140)/(66-82) 115/79 (09/19 0810) SpO2:  [94 %-98 %] 97 % (09/19 0810)  Physical Exam:  General: alert, cooperative, appears stated age, and no distress Lochia: appropriate Uterine Fundus: firm Incision: pressure bandage in place DVT Evaluation: No evidence of DVT seen on physical exam.  Recent Labs    10/20/21 0547  HGB 9.5*  HCT 28.8*    Assessment/Plan: Status post Cesarean section. Doing well postoperatively.  Continue current care DC pressure bandage.  Luz Lex, MD 10/20/2021, 11:55 AM

## 2021-10-20 NOTE — Lactation Note (Signed)
This note was copied from a baby's chart. Lactation Consultation Note  Patient Name: Gina Richard [id number] Date: 10/20/2021 Reason for consult: Initial assessment;Term;Infant weight loss;Maternal endocrine disorder (6.41% WL due to inaccurate birth weight) Age:31  P3, Term, Infant Female, 6.41% WL  LC entered the room and the infant was latched to the right breast in the cross-cradle position.  Per the birth parent, she breast fed her two older children for 12 weeks each.  She states that she never made much milk with her other two children and she was told that she may have IGT.  The birth parent says that she has been latching the baby and supplementing.  [place] asked the parent if she was open to pumping due to low supply.  The birth parent states that she has some nipple soreness, but she has cream for it.  She declined and stated that she attempted to pump and use supplements with her other children and it did not work.  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  She states that she has no questions or concerns.  Current Feeding Plan:  Breastfeed according to feeding cues 8+ times in 24 hours.  Put the infant to the breast prior to supplementing.  Supplement according to supplementation guidelines.  Call [facility] for assistance with breastfeeding.   Maternal Data Does the patient have breastfeeding experience prior to this delivery?: Yes How long did the patient breastfeed?: 12 weeks with two older children  Feeding Mother's Current Feeding Choice: Breast Milk and Formula Nipple Type: Extra Slow Flow  LATCH Score Latch: Grasps breast easily, tongue down, lips flanged, rhythmical sucking.  Audible Swallowing: None  Type of Nipple: Everted at rest and after stimulation  Comfort (Breast/Nipple): Filling, red/small blisters or bruises, mild/mod discomfort  Hold (Positioning): No assistance needed to correctly position infant at breast.  LATCH  Score: 7   Lactation Tools Discussed/Used    Interventions Interventions: [facility] brochure;DEBP  Discharge Pump: DEBP;Employee Pump  Consult Status Consult Status: Follow-up Date: 10/21/21 Follow-up type: In-patient    Lysbeth Penner 10/20/2021, 5:31 PM

## 2021-10-20 NOTE — Social Work (Signed)
CSW received consult for hx of PPD and anxiety. CSW met with MOB to offer support and complete assessment. CSW entered the room, introduced self, CSW role and reason for visit. MOB was agreeable to visit. CSW inquired about how MOB was feeling. MOB reported she was feeling good. MOB reported her surgery was long so it was exhausting. CSW congratulated MOB on baby "Hank". CSW inquired about MOB MH. MOB reported she experienced PPA after both of her pregnancies. MOB reported she experienced loneliness, heart palpitations, and she was sometimes scared to go to sleep because she felt like she was going to die, due to the heart palpitations. MOB reported this lasted for a few weeks after each of her pregnancies. Mob reported she was prescribed Xanax PRN during that time. CSW assessed for safety, MOB denied any SI or HI. MOB identified her spouse and sister as her supports. CSW provided education regarding the baby blues period vs. perinatal mood disorders, discussed treatment and gave resources for mental health follow up if concerns arise.  CSW recommends self-evaluation during the postpartum time period using the New Mom Checklist from Postpartum Progress and encouraged MOB to contact a medical professional if symptoms are noted at any time.    CSW provided review of Sudden Infant Death Syndrome (SIDS) precautions. MOB identified [facility] for infants follow up care. MOB reported they have all necessary item for the infant including a bassinet for him to sleep.  CSW identifies no further need for intervention and no barriers to discharge at this time.   Gina Richard, LCSWA Clinical Social Worker [phone] 

## 2021-10-21 ENCOUNTER — Other Ambulatory Visit (HOSPITAL_COMMUNITY): Payer: Self-pay

## 2021-10-21 MED ORDER — IBUPROFEN 600 MG PO TABS
600.0000 mg | ORAL_TABLET | Freq: Four times a day (QID) | ORAL | 0 refills | Status: AC
  Filled 2021-10-21 (×2): qty 30, 8d supply, fill #0

## 2021-10-21 MED ORDER — OXYCODONE HCL 5 MG PO TABS
5.0000 mg | ORAL_TABLET | ORAL | 0 refills | Status: AC | PRN
  Filled 2021-10-21 (×2): qty 15, 3d supply, fill #0

## 2021-10-21 MED ORDER — ACETAMINOPHEN 500 MG PO TABS
1000.0000 mg | ORAL_TABLET | Freq: Four times a day (QID) | ORAL | 0 refills | Status: AC
  Filled 2021-10-21: qty 30, 4d supply, fill #0

## 2021-10-21 MED ORDER — SENNOSIDES-DOCUSATE SODIUM 8.6-50 MG PO TABS
2.0000 | ORAL_TABLET | Freq: Every day | ORAL | 0 refills | Status: AC
  Filled 2021-10-21: qty 30, 15d supply, fill #0

## 2021-10-21 NOTE — Progress Notes (Signed)
Subjective: POD2 from repeat CS Patient reports tolerating PO, + flatus, and no problems voiding.    Objective: Vital signs in last 24 hours: Temp:  [98.5 F (36.9 C)] 98.5 F (36.9 C) (09/19 1940) Pulse Rate:  [64-75] 75 (09/20 0536) Resp:  [18] 18 (09/19 1940) BP: (123-133)/(70-80) 124/76 (09/20 0536) SpO2:  [99 %-100 %] 99 % (09/20 0536)  Physical Exam:  General: Alert, no distress Lochia: appropriate Uterine Fundus: firm Incision: honeycomb in place, c/d/i DVT Evaluation: No evidence of DVT seen on physical exam.  Recent Labs    10/20/21 0547  HGB 9.5*  HCT 28.8*    Assessment/Plan: Status post Cesarean section. Doing well postoperatively. No issues. Patient strongly desires discharge home Stable, precautions discussed. DC home today, incision check in office next week  Charlotta Newton, MD 10/21/2021, 8:26 AM

## 2021-10-21 NOTE — Discharge Summary (Signed)
Postpartum Discharge Summary    Patient Name: TERSA Richard DOB: 08-Dec-1990 MRN: [id number]  Date of admission: 10/19/2021 Delivery date:10/19/2021  Delivering provider: Linda Hedges  Date of discharge: 10/21/2021  Admitting diagnosis: S/P cesarean section [Z98.891] Previous cesarean section [Z98.891] Intrauterine pregnancy: [redacted]w[redacted]d    Secondary diagnosis:  Principal Problem:   S/P cesarean section Active Problems:   Previous cesarean section  Additional problems: Hepatic adenomas and statosis, Hx PPDx2    Discharge diagnosis: Term Pregnancy Delivered                                              Post partum procedures: none Augmentation: N/A Complications: None  Hospital course: Sceduled C/S   31y.o. yo [id number] at 311w1das admitted to the hospital 10/19/2021 for scheduled cesarean section with the following indication:Elective Repeat.Delivery details are as follows:  Membrane Rupture Time/Date:  ,10/19/2021   Delivery Method:C-Section, Vacuum Assisted  Details of operation can be found in separate operative note.  Patient had an uncomplicated postpartum course.  She is ambulating, tolerating a regular diet, passing flatus, and urinating well. Patient is discharged home in stable condition on  10/21/21        Newborn Data: Birth date:10/19/2021  Birth time:2:53 PM  Gender:Female  Living status:Living  Apgars:8 ,9  Weight:3402 g     Magnesium Sulfate received: No BMZ received: No Rhophylac:No MMR:No T-DaP:Given prenatally Flu: No Transfusion:No  Physical exam  Vitals:   10/20/21 0810 10/20/21 1216 10/20/21 1940 10/21/21 0536  BP: 115/79 123/80 133/70 124/76  Pulse: 64 64 70 75  Resp: 18 18 18    Temp: 98.4 F (36.9 C) 98.5 F (36.9 C) 98.5 F (36.9 C)   TempSrc: Oral Oral Oral   SpO2: 97% 100%  99%  Weight:      Height:       General: alert, cooperative, and no distress Lochia: appropriate Uterine Fundus: firm Incision: Healing well with no significant  drainage, No significant erythema DVT Evaluation: No evidence of DVT seen on physical exam. Labs: Lab Results  Component Value Date   WBC 12.1 (H) 10/20/2021   HGB 9.5 (L) 10/20/2021   HCT 28.8 (L) 10/20/2021   MCV 88.6 10/20/2021   PLT 161 10/20/2021      Latest Ref Rng & Units 12/11/2019    9:15 AM  CMP  Total Protein 6.1 - 8.1 g/dL 6.8   Total Bilirubin 0.2 - 1.2 mg/dL 0.4   AST 10 - 30 U/L 17   ALT 6 - 29 U/L 19    Edinburgh Score:    10/19/2021    5:21 PM  Edinburgh Postnatal Depression Scale Screening Tool  I have been able to laugh and see the funny side of things. 0  I have looked forward with enjoyment to things. 0  I have blamed myself unnecessarily when things went wrong. 1  I have been anxious or worried for no good reason. 2  I have felt scared or panicky for no good reason. 1  Things have been getting on top of me. 1  I have been so unhappy that I have had difficulty sleeping. 1  I have felt sad or miserable. 0  I have been so unhappy that I have been crying. 0  The thought of harming myself has occurred to me. 0  Edinburgh Postnatal Depression  Scale Total 6      After visit meds:  Allergies as of 10/21/2021       Reactions   Shellfish Allergy Nausea And Vomiting        Medication List     STOP taking these medications    amoxicillin 500 MG tablet Commonly known as: AMOXIL       TAKE these medications    acetaminophen 500 MG tablet Commonly known as: TYLENOL Take 500-1,000 mg by mouth every 6 (six) hours as needed (for pain.). What changed: Another medication with the same name was added. Make sure you understand how and when to take each.   acetaminophen 500 MG tablet Commonly known as: TYLENOL Take 2 tablets (1,000 mg total) by mouth every 6 (six) hours. What changed: You were already taking a medication with the same name, and this prescription was added. Make sure you understand how and when to take each.   ibuprofen 600 MG  tablet Commonly known as: ADVIL Take 1 tablet (600 mg total) by mouth every 6 (six) hours.   oxyCODONE 5 MG immediate release tablet Commonly known as: Oxy IR/ROXICODONE Take 1 tablet (5 mg total) by mouth every 4 (four) hours as needed for moderate pain.   prenatal multivitamin Tabs tablet Take 1 tablet by mouth daily.   senna-docusate 8.6-50 MG tablet Commonly known as: Senokot-S Take 2 tablets by mouth daily.         Discharge home in stable condition Infant Feeding: Bottle and Breast Infant Disposition:home with mother Discharge instruction: per After Visit Summary and Postpartum booklet. Activity: Advance as tolerated. Pelvic rest for 6 weeks.  Diet: routine diet Anticipated Birth Control: Unsure Postpartum Appointment: 6 weeks Additional Postpartum F/U: Incision check 1 week   10/21/2021 Charlotta Newton, MD

## 2021-10-27 ENCOUNTER — Telehealth (HOSPITAL_COMMUNITY): Payer: Self-pay | Admitting: *Deleted

## 2021-10-27 NOTE — Telephone Encounter (Signed)
Mom reports feeling good. Incision healing well per mom. No concerns regarding herself at this time. EPDS declined. (hospital score=6) Mom reports baby is well. Feeding, peeing, and pooping without difficulty. Reviewed safe sleep. Mom has no concerns about baby at present.  Odis Hollingshead, RN 10-27-2021 at 2:24pm

## 2021-10-28 ENCOUNTER — Other Ambulatory Visit (HOSPITAL_COMMUNITY): Payer: Self-pay

## 2021-10-28 MED ORDER — CEPHALEXIN 500 MG PO CAPS
500.0000 mg | ORAL_CAPSULE | Freq: Four times a day (QID) | ORAL | 0 refills | Status: DC
  Filled 2021-10-28: qty 28, 7d supply, fill #0

## 2021-10-28 MED ORDER — CLOTRIMAZOLE-BETAMETHASONE 1-0.05 % EX CREA
TOPICAL_CREAM | Freq: Two times a day (BID) | CUTANEOUS | 0 refills | Status: AC | PRN
  Filled 2021-10-28: qty 45, 30d supply, fill #0

## 2022-02-06 IMAGING — US US BREAST*R* LIMITED INC AXILLA
1 series · 4 of 4 positions shown · non-contrast
Comparison: None.

CLINICAL DATA: 30-year-old female with a right breast palpable
lump.

EXAM:
DIGITAL DIAGNOSTIC BILATERAL MAMMOGRAM WITH TOMOSYNTHESIS AND CAD;
ULTRASOUND RIGHT BREAST LIMITED
TECHNIQUE: Bilateral digital diagnostic mammography and breast tomosynthesis
was performed. The images were evaluated with computer-aided
detection.; Targeted ultrasound examination of the right breast was
performed

[Series 1: us breast*right* limited inc axilla · 0.07mm/px · 4 of 4 slices shown]
[im 1/4]
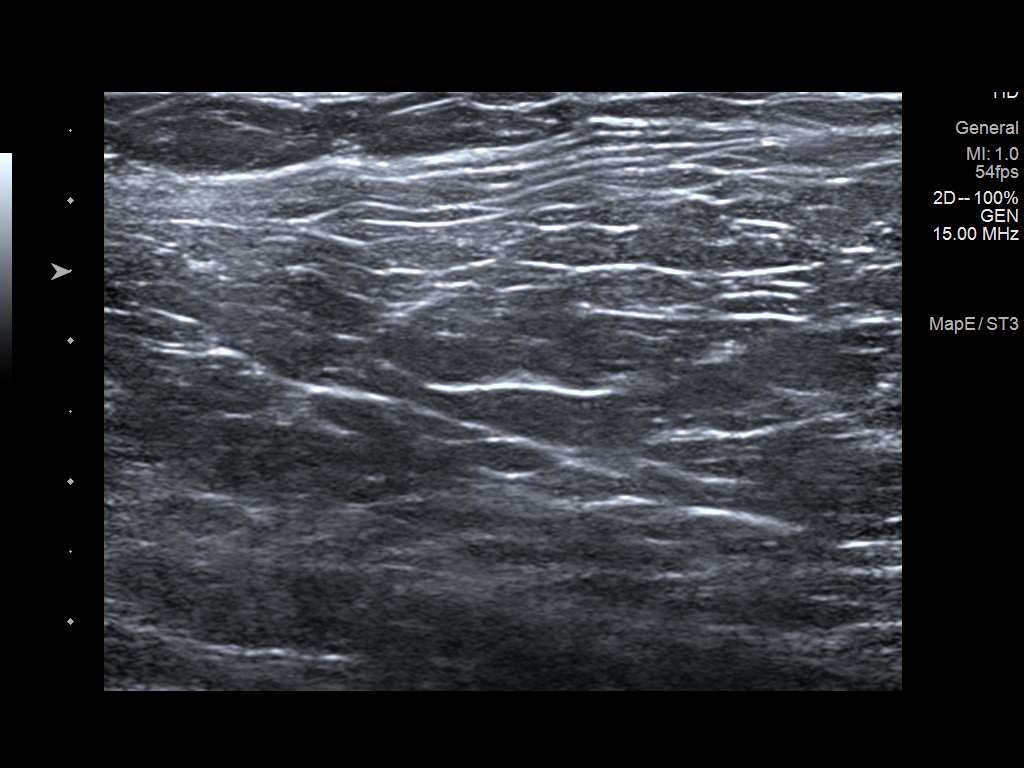
[im 2/4]
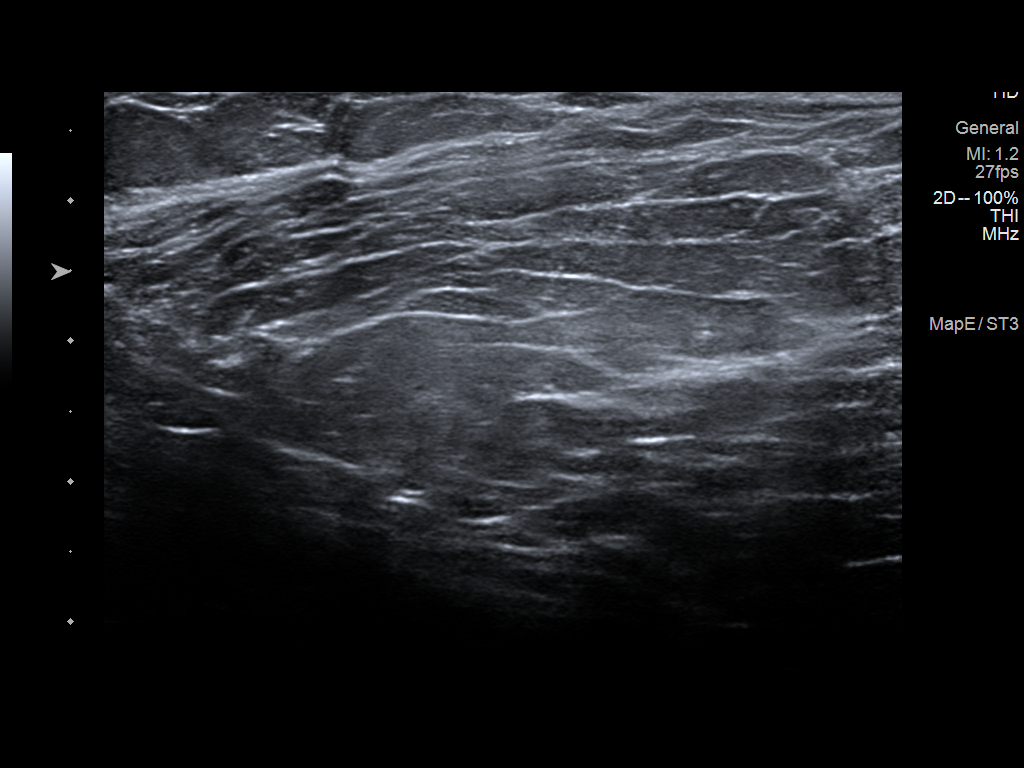
[im 3/4]
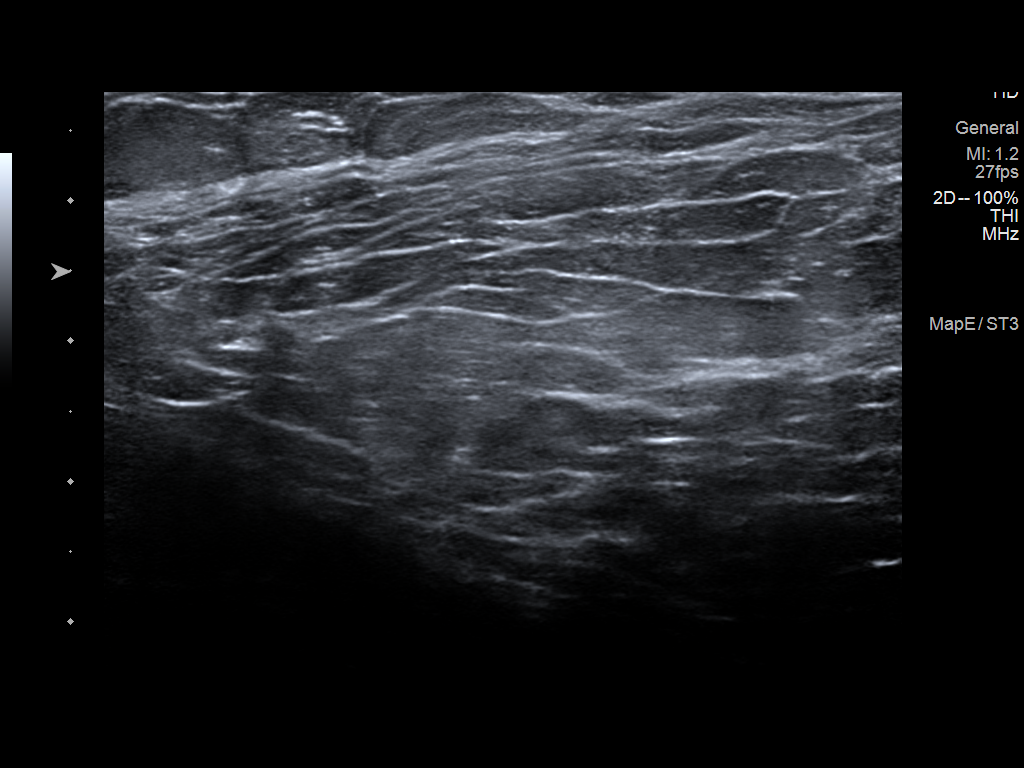
[im 4/4]
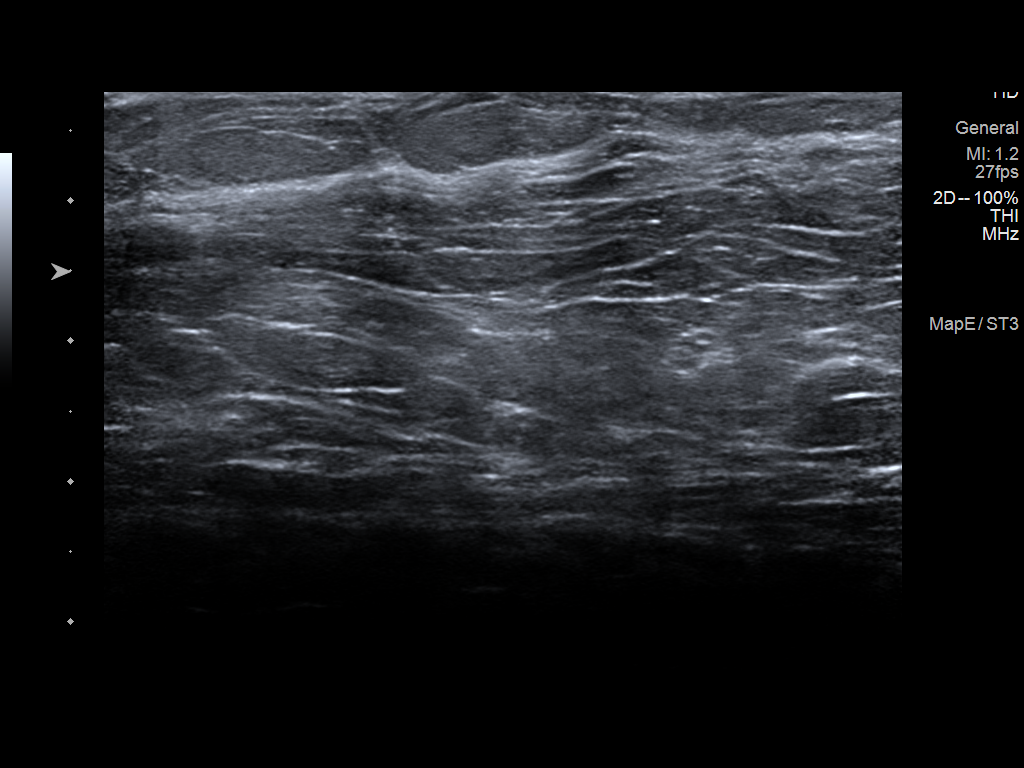

[4 of 4 positions shown; findings below may reference images not displayed]

ACR Breast Density Category b: There are scattered areas of
fibroglandular density.
FINDINGS: A radiopaque BB was placed at the site of the patient's palpable
lump in the upper-outer right breast. No focal suspicious findings
are seen deep to the radiopaque BB. No suspicious mammographic
findings are identified elsewhere within the bilateral breasts.

Targeted ultrasound is performed, showing normal fibroglandular
tissue without focal or suspicious sonographic abnormality.
Evaluation of the upper outer right breast was performed.
IMPRESSION: 1. No mammographic evidence of malignancy in either breast.
2. No suspicious sonographic findings in the right breast.

RECOMMENDATION:
1. Clinical follow-up recommended for the palpable area of concern
in the right breast. Any further workup should be based on clinical
grounds.
2. Screening mammogram at age 40 unless there are persistent or
intervening clinical concerns. (Code:AO-H-LWB)

I have discussed the findings and recommendations with the patient.
If applicable, a reminder letter will be sent to the patient
regarding the next appointment.

BI-RADS CATEGORY  1: Negative.

## 2022-02-06 IMAGING — MG DIGITAL DIAGNOSTIC BILAT W/ TOMO W/ CAD
6 of 10 series · 6 of 30 positions shown · non-contrast
Comparison: None.

CLINICAL DATA: 30-year-old female with a right breast palpable
lump.

EXAM:
DIGITAL DIAGNOSTIC BILATERAL MAMMOGRAM WITH TOMOSYNTHESIS AND CAD;
ULTRASOUND RIGHT BREAST LIMITED
TECHNIQUE: Bilateral digital diagnostic mammography and breast tomosynthesis
was performed. The images were evaluated with computer-aided
detection.; Targeted ultrasound examination of the right breast was
performed

[R MLO synth-2D (1 of 2)]
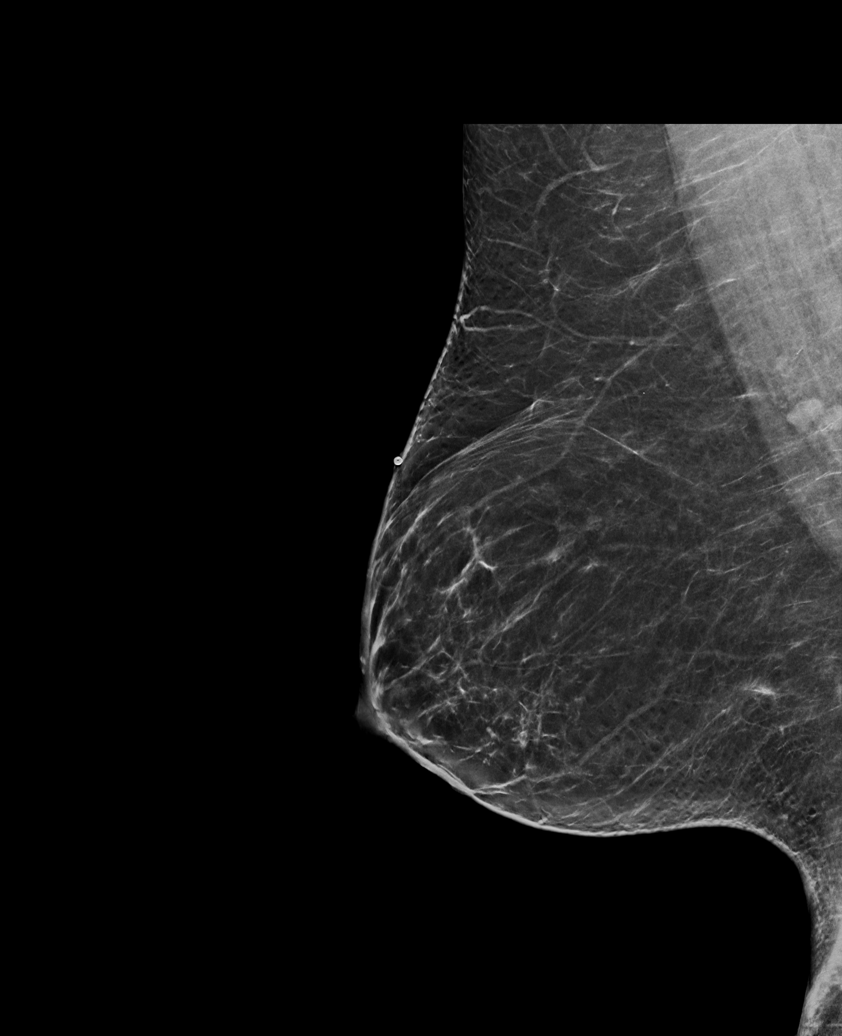

[L CC synth-2D]
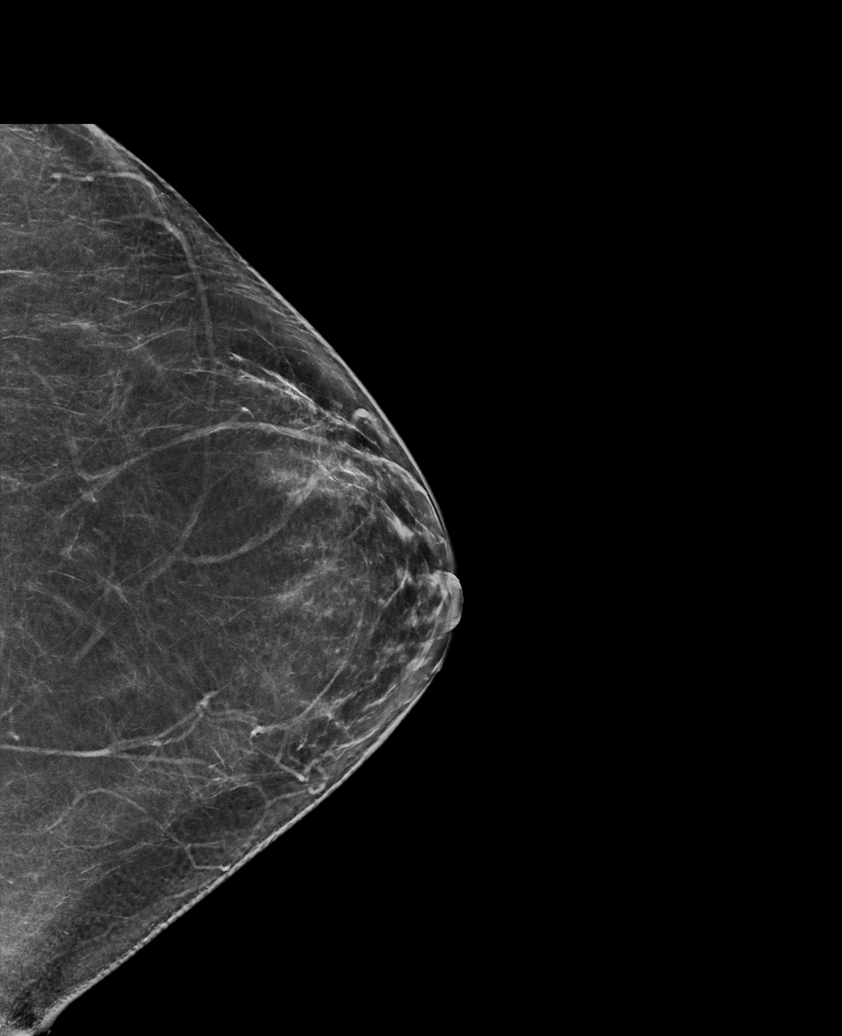

[R MLO synth-2D (2 of 2)]
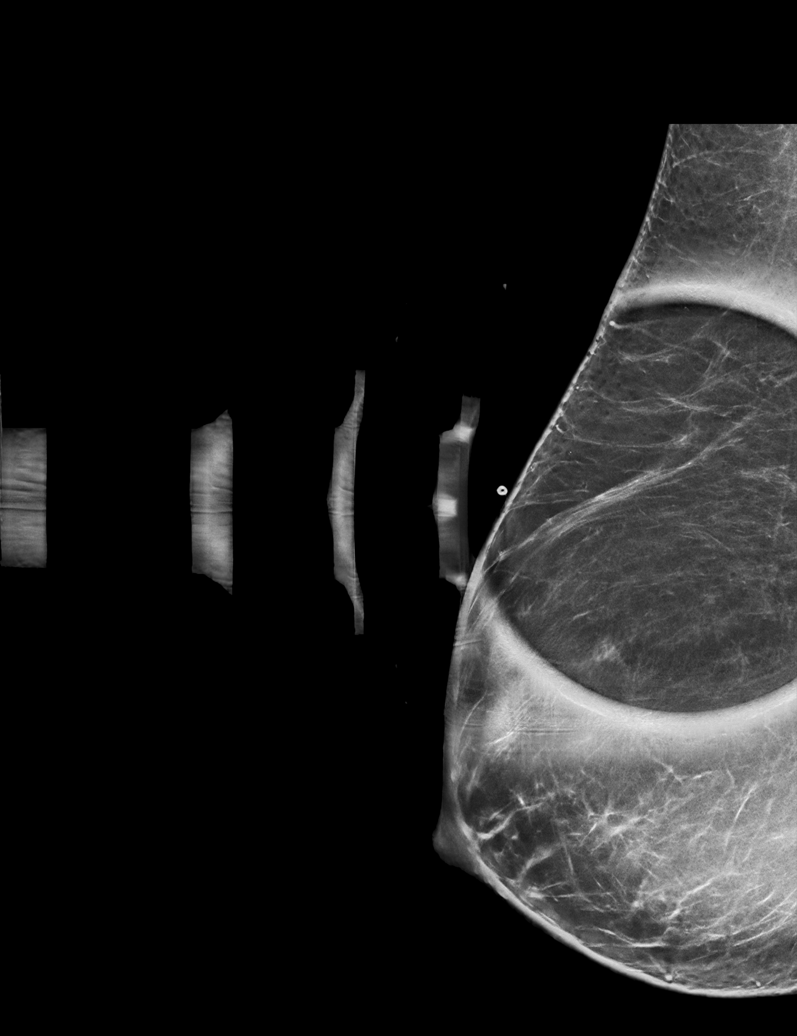

[L MLO synth-2D]
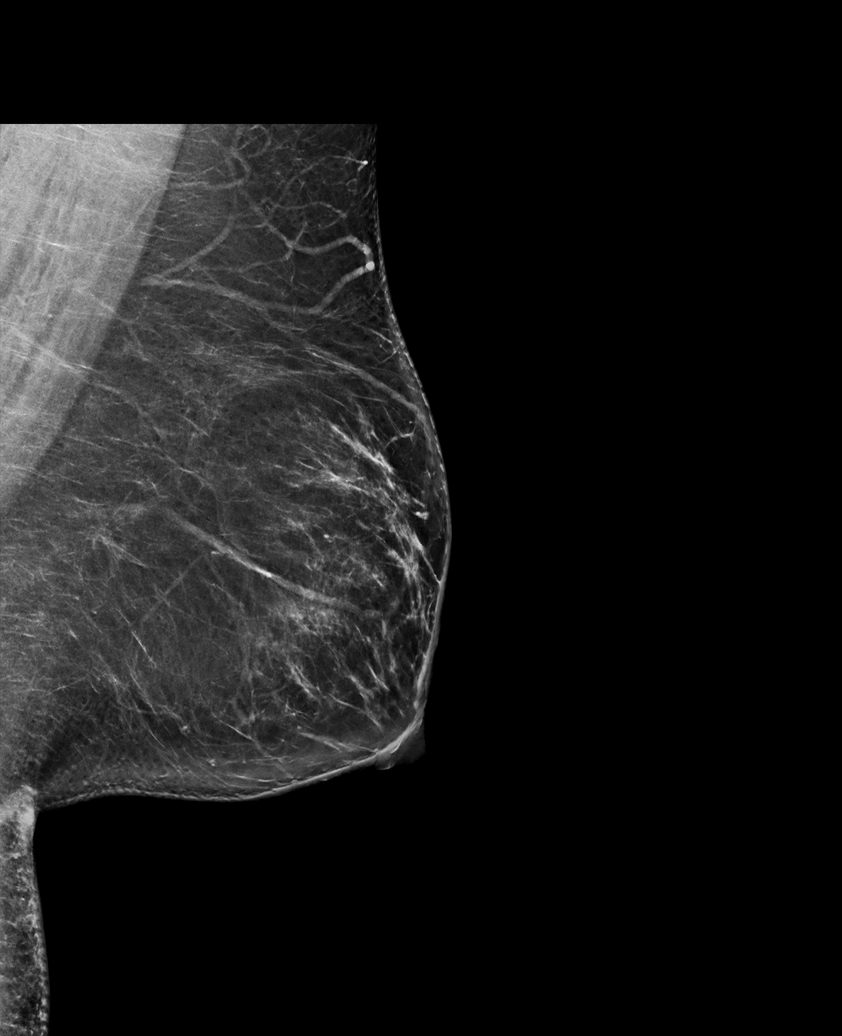

[R CC synth-2D]
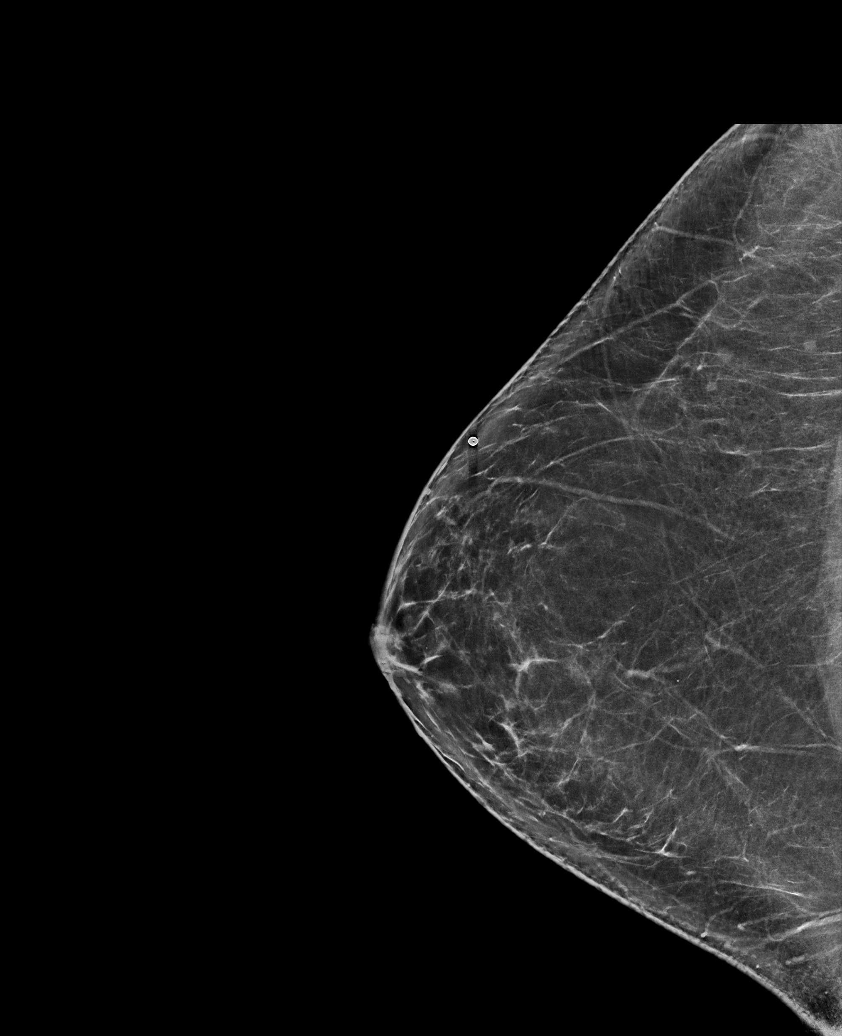

[L CC tomo · tomo slice 39/76.0]
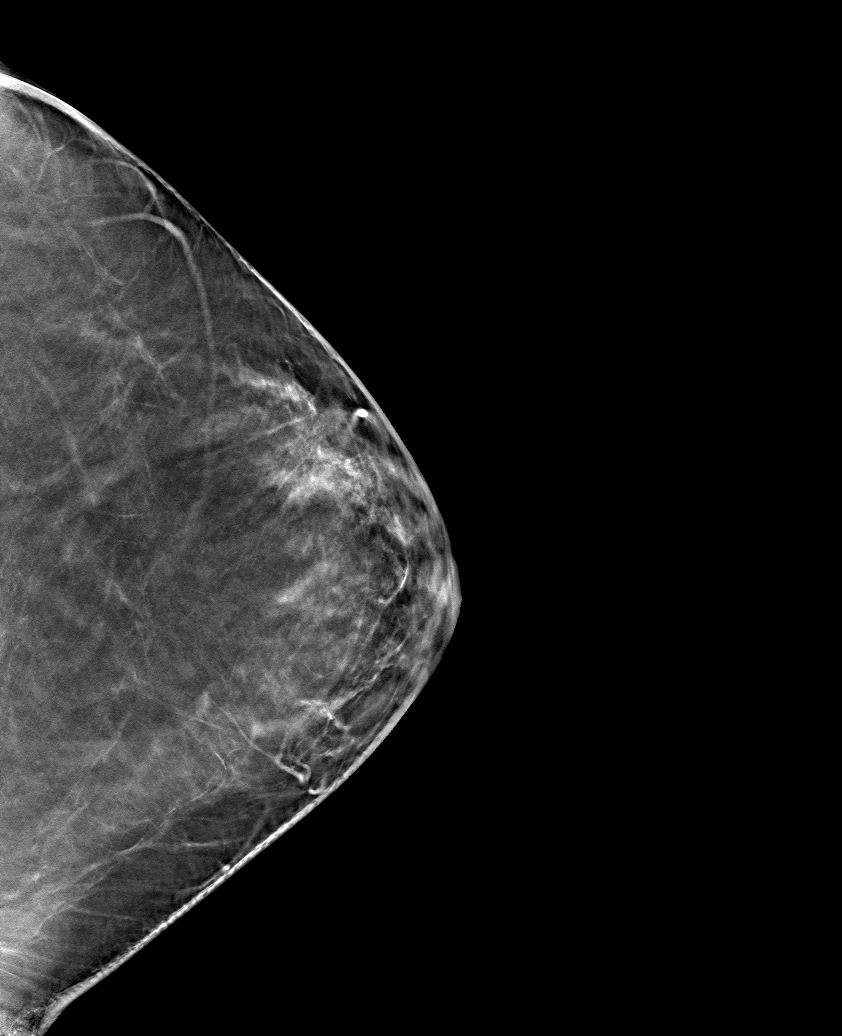

[6 of 30 positions shown; findings below may reference images not displayed]

ACR Breast Density Category b: There are scattered areas of
fibroglandular density.
FINDINGS: A radiopaque BB was placed at the site of the patient's palpable
lump in the upper-outer right breast. No focal suspicious findings
are seen deep to the radiopaque BB. No suspicious mammographic
findings are identified elsewhere within the bilateral breasts.

Targeted ultrasound is performed, showing normal fibroglandular
tissue without focal or suspicious sonographic abnormality.
Evaluation of the upper outer right breast was performed.
IMPRESSION: 1. No mammographic evidence of malignancy in either breast.
2. No suspicious sonographic findings in the right breast.

RECOMMENDATION:
1. Clinical follow-up recommended for the palpable area of concern
in the right breast. Any further workup should be based on clinical
grounds.
2. Screening mammogram at age 39 unless there are persistent or
intervening clinical concerns. (Code:AO-H-LWB)

I have discussed the findings and recommendations with the patient.
If applicable, a reminder letter will be sent to the patient
regarding the next appointment.

BI-RADS CATEGORY  1: Negative.

## 2022-03-15 DIAGNOSIS — H40053 Ocular hypertension, bilateral: Secondary | ICD-10-CM | POA: Diagnosis not present

## 2022-03-15 DIAGNOSIS — H43392 Other vitreous opacities, left eye: Secondary | ICD-10-CM | POA: Diagnosis not present

## 2022-03-15 DIAGNOSIS — H04123 Dry eye syndrome of bilateral lacrimal glands: Secondary | ICD-10-CM | POA: Diagnosis not present

## 2022-10-21 ENCOUNTER — Telehealth: Payer: Self-pay | Admitting: Physician Assistant

## 2022-10-21 DIAGNOSIS — B9689 Other specified bacterial agents as the cause of diseases classified elsewhere: Secondary | ICD-10-CM

## 2022-10-21 DIAGNOSIS — J208 Acute bronchitis due to other specified organisms: Secondary | ICD-10-CM

## 2022-10-21 MED ORDER — BENZONATATE 100 MG PO CAPS: 100.0000 mg | ORAL_CAPSULE | Freq: Three times a day (TID) | ORAL | 0 refills | Status: DC | PRN

## 2022-10-21 MED ORDER — ALBUTEROL SULFATE HFA 108 (90 BASE) MCG/ACT IN AERS: 2.0000 | INHALATION_SPRAY | Freq: Four times a day (QID) | RESPIRATORY_TRACT | 0 refills | Status: AC | PRN

## 2022-10-21 MED ORDER — AZITHROMYCIN 250 MG PO TABS: ORAL_TABLET | ORAL | 0 refills | Status: AC

## 2022-10-21 MED ORDER — PREDNISONE 20 MG PO TABS: 40.0000 mg | ORAL_TABLET | Freq: Every day | ORAL | 0 refills | Status: AC

## 2022-10-21 NOTE — Progress Notes (Signed)
Message sent to patient requesting further input regarding current symptoms. Awaiting patient response.  

## 2022-10-21 NOTE — Progress Notes (Signed)
E-Visit for Cough  We are sorry that you are not feeling well.  Here is how we plan to help!  Based on your presentation I believe you most likely have A cough due to bacteria.  When patients have a fever and a productive cough with a change in color or increased sputum production, we are concerned about bacterial bronchitis.  If left untreated it can progress to pneumonia.  If your symptoms do not improve with your treatment plan it is important that you contact your provider.   I have prescribed Azithromyin 250 mg: two tablets now and then one tablet daily for 4 additonal days    In addition you may use A prescription cough medication called Tessalon Perles 100mg . You may take 1-2 capsules every 8 hours as needed for your cough.  I have also sent in a 5-day burst of prednisone to take as directed.  If symptoms are not quickly improving or anything new or worsening, you need an in-person evaluation ASAP. PLEASE DO NOT DELAY CARE.  From your responses in the eVisit questionnaire you describe inflammation in the upper respiratory tract which is causing a significant cough.  This is commonly called Bronchitis and has four common causes:   Allergies Viral Infections Acid Reflux Bacterial Infection Allergies, viruses and acid reflux are treated by controlling symptoms or eliminating the cause. An example might be a cough caused by taking certain blood pressure medications. You stop the cough by changing the medication. Another example might be a cough caused by acid reflux. Controlling the reflux helps control the cough.  USE OF BRONCHODILATOR ("RESCUE") INHALERS: There is a risk from using your bronchodilator too frequently.  The risk is that over-reliance on a medication which only relaxes the muscles surrounding the breathing tubes can reduce the effectiveness of medications prescribed to reduce swelling and congestion of the tubes themselves.  Although you feel brief relief from the  bronchodilator inhaler, your asthma may actually be worsening with the tubes becoming more swollen and filled with mucus.  This can delay other crucial treatments, such as oral steroid medications. If you need to use a bronchodilator inhaler daily, several times per day, you should discuss this with your provider.  There are probably better treatments that could be used to keep your asthma under control.     HOME CARE Only take medications as instructed by your medical team. Complete the entire course of an antibiotic. Drink plenty of fluids and get plenty of rest. Avoid close contacts especially the very young and the elderly Cover your mouth if you cough or cough into your sleeve. Always remember to wash your hands A steam or ultrasonic humidifier can help congestion.   GET HELP RIGHT AWAY IF: You develop worsening fever. You become short of breath You cough up blood. Your symptoms persist after you have completed your treatment plan MAKE SURE YOU  Understand these instructions. Will watch your condition. Will get help right away if you are not doing well or get worse.    Thank you for choosing an e-visit.  Your e-visit answers were reviewed by a board certified advanced clinical practitioner to complete your personal care plan. Depending upon the condition, your plan could have included both over the counter or prescription medications.  Please review your pharmacy choice. Make sure the pharmacy is open so you can pick up prescription now. If there is a problem, you may contact your provider through MyChart messaging and have the prescription routed to another  pharmacy.  Your safety is important to [place]. If you have drug allergies check your prescription carefully.   For the next 24 hours you can use MyChart to ask questions about today's visit, request a non-urgent call back, or ask for a work or school excuse. You will get an email in the next two days asking about your experience. I  hope that your e-visit has been valuable and will speed your recovery.

## 2022-10-21 NOTE — Progress Notes (Signed)
I have spent 5 minutes in review of e-visit questionnaire, review and updating patient chart, medical decision making and response to patient.   Mia Milan Cody Jacklynn Dehaas, PA-C    

## 2023-01-15 ENCOUNTER — Telehealth: Payer: Self-pay | Admitting: Physician Assistant

## 2023-01-15 DIAGNOSIS — J019 Acute sinusitis, unspecified: Secondary | ICD-10-CM

## 2023-01-15 DIAGNOSIS — B9689 Other specified bacterial agents as the cause of diseases classified elsewhere: Secondary | ICD-10-CM

## 2023-01-15 MED ORDER — AMOXICILLIN-POT CLAVULANATE 875-125 MG PO TABS: 1.0000 | ORAL_TABLET | Freq: Two times a day (BID) | ORAL | 0 refills | Status: AC

## 2023-01-15 NOTE — Progress Notes (Signed)

## 2023-01-15 NOTE — Progress Notes (Signed)
I have spent 5 minutes in review of e-visit questionnaire, review and updating patient chart, medical decision making and response to patient.   Laure Kidney, PA-C

## 2023-02-17 DIAGNOSIS — Z6841 Body Mass Index (BMI) 40.0 and over, adult: Secondary | ICD-10-CM | POA: Diagnosis not present

## 2023-02-17 DIAGNOSIS — Z1151 Encounter for screening for human papillomavirus (HPV): Secondary | ICD-10-CM | POA: Diagnosis not present

## 2023-02-17 DIAGNOSIS — Z124 Encounter for screening for malignant neoplasm of cervix: Secondary | ICD-10-CM | POA: Diagnosis not present

## 2023-02-17 DIAGNOSIS — Z01419 Encounter for gynecological examination (general) (routine) without abnormal findings: Secondary | ICD-10-CM | POA: Diagnosis not present

## 2023-06-22 DIAGNOSIS — H40053 Ocular hypertension, bilateral: Secondary | ICD-10-CM | POA: Diagnosis not present

## 2023-06-22 DIAGNOSIS — H43392 Other vitreous opacities, left eye: Secondary | ICD-10-CM | POA: Diagnosis not present

## 2023-06-27 ENCOUNTER — Telehealth: Admitting: Family Medicine

## 2023-06-27 DIAGNOSIS — J069 Acute upper respiratory infection, unspecified: Secondary | ICD-10-CM

## 2023-06-27 DIAGNOSIS — J019 Acute sinusitis, unspecified: Secondary | ICD-10-CM

## 2023-06-27 MED ORDER — AMOXICILLIN-POT CLAVULANATE 875-125 MG PO TABS: 1.0000 | ORAL_TABLET | Freq: Two times a day (BID) | ORAL | 0 refills | Status: AC

## 2023-06-27 MED ORDER — BENZONATATE 100 MG PO CAPS: 100.0000 mg | ORAL_CAPSULE | Freq: Three times a day (TID) | ORAL | 0 refills | Status: AC | PRN

## 2023-06-27 MED ORDER — IPRATROPIUM BROMIDE 0.03 % NA SOLN: 2.0000 | Freq: Two times a day (BID) | NASAL | 0 refills | Status: AC

## 2023-06-27 NOTE — Progress Notes (Signed)
 Virtual Visit Consent   Gina Richard, you are scheduled for a virtual visit with a [facility] provider today. Just as with appointments in the office, your consent must be obtained to participate. Your consent will be active for this visit and any virtual visit you may have with one of our providers in the next 365 days. If you have a MyChart account, a copy of this consent can be sent to you electronically.  As this is a virtual visit, video technology does not allow for your provider to perform a traditional examination. This may limit your provider's ability to fully assess your condition. If your provider identifies any concerns that need to be evaluated in person or the need to arrange testing (such as labs, EKG, etc.), we will make arrangements to do so. Although advances in technology are sophisticated, we cannot ensure that it will always work on either your end or our end. If the connection with a video visit is poor, the visit may have to be switched to a telephone visit. With either a video or telephone visit, we are not always able to ensure that we have a secure connection.  By engaging in this virtual visit, you consent to the provision of healthcare and authorize for your insurance to be billed (if applicable) for the services provided during this visit. Depending on your insurance coverage, you may receive a charge related to this service.  I need to obtain your verbal consent now. Are you willing to proceed with your visit today? Gina Richard has provided verbal consent on 06/27/2023 for a virtual visit (video or telephone). Gina Richard, [place]  Date: 06/27/2023 4:50 PM   Virtual Visit via Video Note   ILouvenia Richard, connected with  CARIDAD SILVEIRA  ([id number], 1990/03/29) on 06/27/23 at  4:45 PM EDT by a video-enabled telemedicine application and verified that I am speaking with the correct person using two identifiers.  Location: Patient: Virtual Visit Location Patient:  Home Provider: Virtual Visit Location Provider: Home Office   I discussed the limitations of evaluation and management by telemedicine and the availability of in person appointments. The patient expressed understanding and agreed to proceed.    History of Present Illness: Gina Richard is a 33 y.o. who identifies as a female who was assigned female at birth, and is being seen today for c/o upper respiratory infection that started a week and a half ago.  Pt states she checked for covid last Sunday because she was having chills, but it was negative.  Pt states she has a cough and her nose is draining mucus.  Pt states mucus started clear and now is greenish.  Pt states she used Mucinex sinus max and allergy medicine. Has also tried Flonase  as well.   Video connection was lost when less than 50% of the duration of the visit was complete, at which time the remainder of the visit was completed via audio only.  HPI: HPI  Problems:  Patient Active Problem List   Diagnosis Date Noted   Hepatic adenoma 11/13/2019   Fatty liver 11/13/2019   Hematochezia 11/13/2019   Previous cesarean section 07/05/2019   Abnormal glucose tolerance test (GTT) during pregnancy, antepartum 04/27/2019   S/P cesarean section 10/01/2015    Allergies:  Allergies  Allergen Reactions   Shellfish Allergy Nausea And Vomiting   Medications:  Current Outpatient Medications:    acetaminophen  (TYLENOL ) 500 MG tablet, Take 500-1,000 mg by mouth every 6 (six) hours  as needed (for pain.)., Disp: , Rfl:    acetaminophen  (TYLENOL ) 500 MG tablet, Take 2 tablets (1,000 mg total) by mouth every 6 (six) hours., Disp: 30 tablet, Rfl: 0   albuterol  (VENTOLIN  HFA) 108 (90 Base) MCG/ACT inhaler, Inhale 2 puffs into the lungs every 6 (six) hours as needed for wheezing or shortness of breath., Disp: 8 g, Rfl: 0   benzonatate  (TESSALON ) 100 MG capsule, Take 1 capsule (100 mg total) by mouth 3 (three) times daily as needed for up to 7 days.,  Disp: 21 capsule, Rfl: 0   clotrimazole -betamethasone  (LOTRISONE ) cream, Apply topically to the affected and surrounding arears of skin 2 (two) times daily as needed., Disp: 45 g, Rfl: 0   ibuprofen  (ADVIL ) 600 MG tablet, Take 1 tablet (600 mg total) by mouth every 6 (six) hours., Disp: 30 tablet, Rfl: 0   ipratropium (ATROVENT ) 0.03 % nasal spray, Place 2 sprays into both nostrils every 12 (twelve) hours., Disp: 30 mL, Rfl: 0   oxyCODONE  (OXY IR/ROXICODONE ) 5 MG immediate release tablet, Take 1 tablet (5 mg total) by mouth every 4 (four) hours as needed for moderate pain., Disp: 15 tablet, Rfl: 0   predniSONE  (DELTASONE ) 20 MG tablet, Take 2 tablets (40 mg total) by mouth daily with breakfast., Disp: 10 tablet, Rfl: 0   Prenatal Vit-Fe Fumarate-FA (PRENATAL MULTIVITAMIN) TABS tablet, Take 1 tablet by mouth daily., Disp: , Rfl:    senna-docusate (SENOKOT-S) 8.6-50 MG tablet, Take 2 tablets by mouth daily., Disp: 30 tablet, Rfl: 0  Observations/Objective: Patient is well-developed, well-nourished in no acute distress.  Resting comfortably at home.  Head is normocephalic, atraumatic.  No labored breathing.  Speech is clear and coherent with logical content.  Patient is alert and oriented at baseline.    Assessment and Plan: 1. Acute sinusitis, recurrence not specified, unspecified location (Primary) -Pt was started on Tessalon  capsules and Ipratropium bromide  nasal spray during e-visit earlier this morning.  Pt failed to mention changes in mucus until she had a virtual visit today  -Advised Pt if worsening symptoms to F/U with PCP or in person urgent care.    Follow Up Instructions: I discussed the assessment and treatment plan with the patient. The patient was provided an opportunity to ask questions and all were answered. The patient agreed with the plan and demonstrated an understanding of the instructions.  A copy of instructions were sent to the patient via MyChart unless otherwise noted  below.    The patient was advised to call back or seek an in-person evaluation if the symptoms worsen or if the condition fails to improve as anticipated.    Gina Roys, PA-C

## 2023-06-27 NOTE — Patient Instructions (Signed)
 Howard Macho, thank you for joining [name], PA-C for today's virtual visit.  While this provider is not your primary care provider (PCP), if your PCP is located in our provider database this encounter information will be shared with them immediately following your visit.   A [facility] MyChart account gives you access to today's visit and all your visits, tests, and labs performed at [facility] " click here if you don't have a [facility] MyChart account or go to [email or web address]  Consent: (Patient) Gina Richard provided verbal consent for this virtual visit at the beginning of the encounter.  Current Medications:  Current Outpatient Medications:    amoxicillin -clavulanate (AUGMENTIN ) 875-125 MG tablet, Take 1 tablet by mouth 2 (two) times daily for 7 days., Disp: 14 tablet, Rfl: 0   acetaminophen  (TYLENOL ) 500 MG tablet, Take 500-1,000 mg by mouth every 6 (six) hours as needed (for pain.)., Disp: , Rfl:    acetaminophen  (TYLENOL ) 500 MG tablet, Take 2 tablets (1,000 mg total) by mouth every 6 (six) hours., Disp: 30 tablet, Rfl: 0   albuterol  (VENTOLIN  HFA) 108 (90 Base) MCG/ACT inhaler, Inhale 2 puffs into the lungs every 6 (six) hours as needed for wheezing or shortness of breath., Disp: 8 g, Rfl: 0   benzonatate  (TESSALON ) 100 MG capsule, Take 1 capsule (100 mg total) by mouth 3 (three) times daily as needed for up to 7 days., Disp: 21 capsule, Rfl: 0   clotrimazole -betamethasone  (LOTRISONE ) cream, Apply topically to the affected and surrounding arears of skin 2 (two) times daily as needed., Disp: 45 g, Rfl: 0   ibuprofen  (ADVIL ) 600 MG tablet, Take 1 tablet (600 mg total) by mouth every 6 (six) hours., Disp: 30 tablet, Rfl: 0   ipratropium (ATROVENT ) 0.03 % nasal spray, Place 2 sprays into both nostrils every 12 (twelve) hours., Disp: 30 mL, Rfl: 0   oxyCODONE  (OXY IR/ROXICODONE ) 5 MG immediate release tablet, Take 1 tablet (5 mg total) by mouth every 4 (four)  hours as needed for moderate pain., Disp: 15 tablet, Rfl: 0   predniSONE  (DELTASONE ) 20 MG tablet, Take 2 tablets (40 mg total) by mouth daily with breakfast., Disp: 10 tablet, Rfl: 0   Prenatal Vit-Fe Fumarate-FA (PRENATAL MULTIVITAMIN) TABS tablet, Take 1 tablet by mouth daily., Disp: , Rfl:    senna-docusate (SENOKOT-S) 8.6-50 MG tablet, Take 2 tablets by mouth daily., Disp: 30 tablet, Rfl: 0   Medications ordered in this encounter:  Meds ordered this encounter  Medications   amoxicillin -clavulanate (AUGMENTIN ) 875-125 MG tablet    Sig: Take 1 tablet by mouth 2 (two) times daily for 7 days.    Dispense:  14 tablet    Refill:  0     *If you need refills on other medications prior to your next appointment, please contact your pharmacy*  Follow-Up: Call back or seek an in-person evaluation if the symptoms worsen or if the condition fails to improve as anticipated.  [facility] [phone]  Other Instructions Sinus Infection, Adult A sinus infection, also called sinusitis, is inflammation of your sinuses. Sinuses are hollow spaces in the bones around your face. Your sinuses are located: Around your eyes. In the middle of your forehead. Behind your nose. In your cheekbones. Mucus normally drains out of your sinuses. When your nasal tissues become inflamed or swollen, mucus can become trapped or blocked. This allows bacteria, viruses, and fungi to grow, which leads to infection. Most infections of the sinuses are caused  by a virus. A sinus infection can develop quickly. It can last for up to 4 weeks (acute) or for more than 12 weeks (chronic). A sinus infection often develops after a cold. What are the causes? This condition is caused by anything that creates swelling in the sinuses or stops mucus from draining. This includes: Allergies. Asthma. Infection from bacteria or viruses. Deformities or blockages in your nose or sinuses. Abnormal growths in the nose (nasal  polyps). Pollutants, such as chemicals or irritants in the air. Infection from fungi. This is rare. What increases the risk? You are more likely to develop this condition if you: Have a weak body defense system (immune system). Do a lot of swimming or diving. Overuse nasal sprays. Smoke. What are the signs or symptoms? The main symptoms of this condition are pain and a feeling of pressure around the affected sinuses. Other symptoms include: Stuffy nose or congestion that makes it difficult to breathe through your nose. Thick yellow or greenish drainage from your nose. Tenderness, swelling, and warmth over the affected sinuses. A cough that may get worse at night. Decreased sense of smell and taste. Extra mucus that collects in the throat or the back of the nose (postnasal drip) causing a sore throat or bad breath. Tiredness (fatigue). Fever. How is this diagnosed? This condition is diagnosed based on: Your symptoms. Your medical history. A physical exam. Tests to find out if your condition is acute or chronic. This may include: Checking your nose for nasal polyps. Viewing your sinuses using a device that has a light (endoscope). Testing for allergies or bacteria. Imaging tests, such as an MRI or CT scan. In rare cases, a bone biopsy may be done to rule out more serious types of fungal sinus disease. How is this treated? Treatment for a sinus infection depends on the cause and whether your condition is chronic or acute. If caused by a virus, your symptoms should go away on their own within 10 days. You may be given medicines to relieve symptoms. They include: Medicines that shrink swollen nasal passages (decongestants). A spray that eases inflammation of the nostrils (topical intranasal corticosteroids). Rinses that help get rid of thick mucus in your nose (nasal saline washes). Medicines that treat allergies (antihistamines). Over-the-counter pain relievers. If caused by  bacteria, your health care provider may recommend waiting to see if your symptoms improve. Most bacterial infections will get better without antibiotic medicine. You may be given antibiotics if you have: A severe infection. A weak immune system. If caused by narrow nasal passages or nasal polyps, surgery may be needed. Follow these instructions at home: Medicines Take, use, or apply over-the-counter and prescription medicines only as told by your health care provider. These may include nasal sprays. If you were prescribed an antibiotic medicine, take it as told by your health care provider. Do not stop taking the antibiotic even if you start to feel better. Hydrate and humidify  Drink enough fluid to keep your urine pale yellow. Staying hydrated will help to thin your mucus. Use a cool mist humidifier to keep the humidity level in your home above 50%. Inhale steam for 10-15 minutes, 3-4 times a day, or as told by your health care provider. You can do this in the bathroom while a hot shower is running. Limit your exposure to cool or dry air. Rest Rest as much as possible. Sleep with your head raised (elevated). Make sure you get enough sleep each night. General instructions  Apply a  warm, moist washcloth to your face 3-4 times a day or as told by your health care provider. This will help with discomfort. Use nasal saline washes as often as told by your health care provider. Wash your hands often with soap and water  to reduce your exposure to germs. If soap and water  are not available, use hand sanitizer. Do not smoke. Avoid being around people who are smoking (secondhand smoke). Keep all follow-up visits. This is important. Contact a health care provider if: You have a fever. Your symptoms get worse. Your symptoms do not improve within 10 days. Get help right away if: You have a severe headache. You have persistent vomiting. You have severe pain or swelling around your face or  eyes. You have vision problems. You develop confusion. Your neck is stiff. You have trouble breathing. These symptoms may be an emergency. Get help right away. Call 911. Do not wait to see if the symptoms will go away. Do not drive yourself to the hospital. Summary A sinus infection is soreness and inflammation of your sinuses. Sinuses are hollow spaces in the bones around your face. This condition is caused by nasal tissues that become inflamed or swollen. The swelling traps or blocks the flow of mucus. This allows bacteria, viruses, and fungi to grow, which leads to infection. If you were prescribed an antibiotic medicine, take it as told by your health care provider. Do not stop taking the antibiotic even if you start to feel better. Keep all follow-up visits. This is important. This information is not intended to replace advice given to you by your health care provider. Make sure you discuss any questions you have with your health care provider. Document Revised: 12/23/2020 Document Reviewed: 12/23/2020 Elsevier Patient Education  2024 [employer]   If you have been instructed to have an in-person evaluation today at a local Urgent Care facility, please use the link below. It will take you to a list of all of our available [facility], including address, phone number and hours of operation. Please do not delay care.  [facility]  If you or a family member do not have a primary care provider, use the link below to schedule a visit and establish care. When you choose a [facility] primary care physician or advanced practice provider, you gain a long-term partner in health. Find a Primary Care Provider  Learn more about [facility]'s in-office and virtual care options: [facility] - Get Care Now

## 2023-06-27 NOTE — Progress Notes (Signed)
 E-Visit for Upper Respiratory Infection   We are sorry you are not feeling well.  Here is how we plan to help!  Based on what you have shared with me, it looks like you may have a viral upper respiratory infection.  Upper respiratory infections are caused by a large number of viruses; however, rhinovirus is the most common cause.   Symptoms vary from person to person, with common symptoms including sore throat, cough, fatigue or lack of energy and feeling of general discomfort.  A low-grade fever of up to 100.4 may present, but is often uncommon.  Symptoms vary however, and are closely related to a person's age or underlying illnesses.  The most common symptoms associated with an upper respiratory infection are nasal discharge or congestion, cough, sneezing, headache and pressure in the ears and face.  These symptoms usually persist for about 3 to 10 days, but can last up to 2 weeks.  It is important to know that upper respiratory infections do not cause serious illness or complications in most cases.    Upper respiratory infections can be transmitted from person to person, with the most common method of transmission being a person's hands.  The virus is able to live on the skin and can infect other persons for up to 2 hours after direct contact.  Also, these can be transmitted when someone coughs or sneezes; thus, it is important to cover the mouth to reduce this risk.  To keep the spread of the illness at bay, good hand hygiene is very important.  This is an infection that is most likely caused by a virus. There are no specific treatments other than to help you with the symptoms until the infection runs its course.  We are sorry you are not feeling well.  Here is how we plan to help!   For nasal congestion, you may use an oral decongestants such as Mucinex D or if you have glaucoma or high blood pressure use plain Mucinex.  Saline nasal spray or nasal drops can help and can safely be used as often as  needed for congestion.  For your congestion, I have prescribed Ipratropium Bromide  nasal spray 0.03% two sprays in each nostril 2-3 times a day. Stop the Flonase .   If you do not have a history of heart disease, hypertension, diabetes or thyroid disease, prostate/bladder issues or glaucoma, you may also use Sudafed to treat nasal congestion.  It is highly recommended that you consult with a pharmacist or your primary care physician to ensure this medication is safe for you to take.     If you have a cough, you may use cough suppressants such as Delsym and Robitussin.  If you have glaucoma or high blood pressure, you can also use Coricidin HBP.   For cough I have prescribed for you A prescription cough medication called Tessalon  Perles 100 mg. You may take 1-2 capsules every 8 hours as needed for cough  If you have a sore or scratchy throat, use a saltwater gargle-  to  teaspoon of salt dissolved in a 4-ounce to 8-ounce glass of warm water .  Gargle the solution for approximately 15-30 seconds and then spit.  It is important not to swallow the solution.  You can also use throat lozenges/cough drops and Chloraseptic spray to help with throat pain or discomfort.  Warm or cold liquids can also be helpful in relieving throat pain.  For headache, pain or general discomfort, you can use Ibuprofen  or Tylenol   as directed.   Some authorities believe that zinc sprays or the use of Echinacea may shorten the course of your symptoms.   HOME CARE Only take medications as instructed by your medical team. Be sure to drink plenty of fluids. Water  is fine as well as fruit juices, sodas and electrolyte beverages. You may want to stay away from caffeine or alcohol. If you are nauseated, try taking small sips of liquids. How do you know if you are getting enough fluid? Your urine should be a pale yellow or almost colorless. Get rest. Taking a steamy shower or using a humidifier may help nasal congestion and ease sore  throat pain. You can place a towel over your head and breathe in the steam from hot water  coming from a faucet. Using a saline nasal spray works much the same way. Cough drops, hard candies and sore throat lozenges may ease your cough. Avoid close contacts especially the very young and the elderly Cover your mouth if you cough or sneeze Always remember to wash your hands.   GET HELP RIGHT AWAY IF: You develop worsening fever. If your symptoms do not improve within 10 days You develop yellow or green discharge from your nose over 3 days. You have coughing fits You develop a severe head ache or visual changes. You develop shortness of breath, difficulty breathing or start having chest pain Your symptoms persist after you have completed your treatment plan  MAKE SURE YOU  Understand these instructions. Will watch your condition. Will get help right away if you are not doing well or get worse.  Thank you for choosing an e-visit.  Your e-visit answers were reviewed by a board certified advanced clinical practitioner to complete your personal care plan. Depending upon the condition, your plan could have included both over the counter or prescription medications.  Please review your pharmacy choice. Make sure the pharmacy is open so you can pick up prescription now. If there is a problem, you may contact your provider through [employer] and have the prescription routed to another pharmacy.  Your safety is important to us . If you have drug allergies check your prescription carefully.   For the next 24 hours you can use MyChart to ask questions about today's visit, request a non-urgent call back, or ask for a work or school excuse. You will get an email in the next two days asking about your experience. I hope that your e-visit has been valuable and will speed your recovery.  I have spent 5 minutes in review of e-visit questionnaire, review and updating patient chart, medical decision  making and response to patient.   [name], PA-C

## 2023-08-11 ENCOUNTER — Telehealth: Admitting: Emergency Medicine

## 2023-08-11 DIAGNOSIS — R3989 Other symptoms and signs involving the genitourinary system: Secondary | ICD-10-CM | POA: Diagnosis not present

## 2023-08-11 MED ORDER — CEPHALEXIN 500 MG PO CAPS: 500.0000 mg | ORAL_CAPSULE | Freq: Two times a day (BID) | ORAL | 0 refills | Status: AC

## 2023-08-11 NOTE — Progress Notes (Signed)
E-Visit for Urinary Problems  We are sorry that you are not feeling well.  Here is how we plan to help!  Based on what you shared with me it looks like you most likely have a simple urinary tract infection.  A UTI (Urinary Tract Infection) is a bacterial infection of the bladder.  Most cases of urinary tract infections are simple to treat but a key part of your care is to encourage you to drink plenty of fluids and watch your symptoms carefully.  I have prescribed Keflex 500 mg twice a day for 7 days.  Your symptoms should gradually improve. Call us if the burning in your urine worsens, you develop worsening fever, back pain or pelvic pain or if your symptoms do not resolve after completing the antibiotic.  Urinary tract infections can be prevented by drinking plenty of water to keep your body hydrated.  Also be sure when you wipe, wipe from front to back and don't hold it in!  If possible, empty your bladder every 4 hours.  HOME CARE Drink plenty of fluids Compete the full course of the antibiotics even if the symptoms resolve Remember, when you need to go.go. Holding in your urine can increase the likelihood of getting a UTI! GET HELP RIGHT AWAY IF: You cannot urinate You get a high fever Worsening back pain occurs You see blood in your urine You feel sick to your stomach or throw up You feel like you are going to pass out  MAKE SURE YOU  Understand these instructions. Will watch your condition. Will get help right away if you are not doing well or get worse.   Thank you for choosing an e-visit.  Your e-visit answers were reviewed by a board certified advanced clinical practitioner to complete your personal care plan. Depending upon the condition, your plan could have included both over the counter or prescription medications.  Please review your pharmacy choice. Make sure the pharmacy is open so you can pick up prescription now. If there is a problem, you may contact your  provider through MyChart messaging and have the prescription routed to another pharmacy.  Your safety is important to us. If you have drug allergies check your prescription carefully.   For the next 24 hours you can use MyChart to ask questions about today's visit, request a non-urgent call back, or ask for a work or school excuse. You will get an email in the next two days asking about your experience. I hope that your e-visit has been valuable and will speed your recovery.  Approximately 5 minutes was used in reviewing the patient's chart, questionnaire, prescribing medications, and documentation.
